# Patient Record
Sex: Male | Born: 1965 | Race: White | Hispanic: No | Smoking: Current every day smoker
Health system: Southern US, Community
[De-identification: ages and names within clinical notes are randomized; demographics above are authoritative.]

## PROBLEM LIST (undated history)

## (undated) DIAGNOSIS — Z9889 Other specified postprocedural states: Secondary | ICD-10-CM

## (undated) DIAGNOSIS — R51 Headache: Secondary | ICD-10-CM

## (undated) DIAGNOSIS — R112 Nausea with vomiting, unspecified: Secondary | ICD-10-CM

## (undated) DIAGNOSIS — T4145XA Adverse effect of unspecified anesthetic, initial encounter: Secondary | ICD-10-CM

## (undated) DIAGNOSIS — I2510 Atherosclerotic heart disease of native coronary artery without angina pectoris: Secondary | ICD-10-CM

## (undated) DIAGNOSIS — T8859XA Other complications of anesthesia, initial encounter: Secondary | ICD-10-CM

## (undated) DIAGNOSIS — Z8489 Family history of other specified conditions: Secondary | ICD-10-CM

## (undated) DIAGNOSIS — I739 Peripheral vascular disease, unspecified: Secondary | ICD-10-CM

## (undated) HISTORY — DX: Peripheral vascular disease, unspecified: I73.9

## (undated) HISTORY — PX: CORONARY ANGIOPLASTY WITH STENT PLACEMENT: SHX49

## (undated) HISTORY — PX: CATARACT EXTRACTION, BILATERAL: SHX1313

---

## 1997-12-19 ENCOUNTER — Emergency Department (HOSPITAL_COMMUNITY): Admission: EM | Admit: 1997-12-19 | Discharge: 1997-12-19 | Payer: Self-pay

## 1997-12-26 ENCOUNTER — Emergency Department (HOSPITAL_COMMUNITY): Admission: EM | Admit: 1997-12-26 | Discharge: 1997-12-26 | Payer: Self-pay | Admitting: Emergency Medicine

## 1997-12-26 ENCOUNTER — Emergency Department (HOSPITAL_COMMUNITY): Admission: EM | Admit: 1997-12-26 | Discharge: 1997-12-26 | Payer: Self-pay

## 1998-07-26 ENCOUNTER — Emergency Department (HOSPITAL_COMMUNITY): Admission: EM | Admit: 1998-07-26 | Discharge: 1998-07-26 | Payer: Self-pay | Admitting: Emergency Medicine

## 1998-09-14 ENCOUNTER — Encounter: Payer: Self-pay | Admitting: Emergency Medicine

## 1998-09-14 ENCOUNTER — Emergency Department (HOSPITAL_COMMUNITY): Admission: EM | Admit: 1998-09-14 | Discharge: 1998-09-14 | Payer: Self-pay | Admitting: Emergency Medicine

## 1998-09-29 ENCOUNTER — Emergency Department (HOSPITAL_COMMUNITY): Admission: EM | Admit: 1998-09-29 | Discharge: 1998-09-29 | Payer: Self-pay | Admitting: Emergency Medicine

## 1998-12-04 ENCOUNTER — Emergency Department (HOSPITAL_COMMUNITY): Admission: EM | Admit: 1998-12-04 | Discharge: 1998-12-04 | Payer: Self-pay | Admitting: Internal Medicine

## 1999-03-20 ENCOUNTER — Emergency Department (HOSPITAL_COMMUNITY): Admission: EM | Admit: 1999-03-20 | Discharge: 1999-03-20 | Payer: Self-pay | Admitting: Emergency Medicine

## 1999-07-14 ENCOUNTER — Emergency Department (HOSPITAL_COMMUNITY): Admission: EM | Admit: 1999-07-14 | Discharge: 1999-07-14 | Payer: Self-pay | Admitting: Emergency Medicine

## 1999-07-14 ENCOUNTER — Encounter: Payer: Self-pay | Admitting: Emergency Medicine

## 1999-07-23 ENCOUNTER — Emergency Department (HOSPITAL_COMMUNITY): Admission: EM | Admit: 1999-07-23 | Discharge: 1999-07-23 | Payer: Self-pay | Admitting: Emergency Medicine

## 2000-01-04 ENCOUNTER — Emergency Department (HOSPITAL_COMMUNITY): Admission: EM | Admit: 2000-01-04 | Discharge: 2000-01-04 | Payer: Self-pay | Admitting: *Deleted

## 2000-01-28 ENCOUNTER — Emergency Department (HOSPITAL_COMMUNITY): Admission: EM | Admit: 2000-01-28 | Discharge: 2000-01-28 | Payer: Self-pay

## 2000-05-14 ENCOUNTER — Emergency Department (HOSPITAL_COMMUNITY): Admission: EM | Admit: 2000-05-14 | Discharge: 2000-05-14 | Payer: Self-pay | Admitting: Internal Medicine

## 2000-08-04 ENCOUNTER — Emergency Department (HOSPITAL_COMMUNITY): Admission: EM | Admit: 2000-08-04 | Discharge: 2000-08-04 | Payer: Self-pay | Admitting: Emergency Medicine

## 2000-08-04 ENCOUNTER — Encounter: Payer: Self-pay | Admitting: Emergency Medicine

## 2000-09-04 ENCOUNTER — Emergency Department (HOSPITAL_COMMUNITY): Admission: EM | Admit: 2000-09-04 | Discharge: 2000-09-04 | Payer: Self-pay | Admitting: Emergency Medicine

## 2000-09-04 ENCOUNTER — Encounter: Payer: Self-pay | Admitting: Emergency Medicine

## 2001-04-02 ENCOUNTER — Encounter: Payer: Self-pay | Admitting: Emergency Medicine

## 2001-04-02 ENCOUNTER — Emergency Department (HOSPITAL_COMMUNITY): Admission: EM | Admit: 2001-04-02 | Discharge: 2001-04-02 | Payer: Self-pay | Admitting: Emergency Medicine

## 2001-06-26 ENCOUNTER — Encounter: Payer: Self-pay | Admitting: Emergency Medicine

## 2001-06-26 ENCOUNTER — Emergency Department (HOSPITAL_COMMUNITY): Admission: EM | Admit: 2001-06-26 | Discharge: 2001-06-26 | Payer: Self-pay | Admitting: Emergency Medicine

## 2001-08-05 ENCOUNTER — Emergency Department (HOSPITAL_COMMUNITY): Admission: EM | Admit: 2001-08-05 | Discharge: 2001-08-05 | Payer: Self-pay | Admitting: Emergency Medicine

## 2001-08-20 ENCOUNTER — Emergency Department (HOSPITAL_COMMUNITY): Admission: EM | Admit: 2001-08-20 | Discharge: 2001-08-20 | Payer: Self-pay | Admitting: Emergency Medicine

## 2001-08-20 ENCOUNTER — Encounter: Payer: Self-pay | Admitting: Emergency Medicine

## 2001-10-26 ENCOUNTER — Emergency Department (HOSPITAL_COMMUNITY): Admission: EM | Admit: 2001-10-26 | Discharge: 2001-10-26 | Payer: Self-pay | Admitting: Emergency Medicine

## 2001-10-26 ENCOUNTER — Encounter: Payer: Self-pay | Admitting: Emergency Medicine

## 2001-10-28 ENCOUNTER — Emergency Department (HOSPITAL_COMMUNITY): Admission: EM | Admit: 2001-10-28 | Discharge: 2001-10-28 | Payer: Self-pay | Admitting: Emergency Medicine

## 2002-01-21 ENCOUNTER — Emergency Department (HOSPITAL_COMMUNITY): Admission: EM | Admit: 2002-01-21 | Discharge: 2002-01-21 | Payer: Self-pay | Admitting: Emergency Medicine

## 2002-07-12 ENCOUNTER — Emergency Department (HOSPITAL_COMMUNITY): Admission: EM | Admit: 2002-07-12 | Discharge: 2002-07-12 | Payer: Self-pay | Admitting: Emergency Medicine

## 2002-08-19 ENCOUNTER — Emergency Department (HOSPITAL_COMMUNITY): Admission: EM | Admit: 2002-08-19 | Discharge: 2002-08-19 | Payer: Self-pay | Admitting: Emergency Medicine

## 2003-08-23 ENCOUNTER — Emergency Department (HOSPITAL_COMMUNITY): Admission: EM | Admit: 2003-08-23 | Discharge: 2003-08-23 | Payer: Self-pay | Admitting: Emergency Medicine

## 2003-12-02 ENCOUNTER — Emergency Department (HOSPITAL_COMMUNITY): Admission: EM | Admit: 2003-12-02 | Discharge: 2003-12-02 | Payer: Self-pay | Admitting: Emergency Medicine

## 2004-01-12 ENCOUNTER — Emergency Department (HOSPITAL_COMMUNITY): Admission: EM | Admit: 2004-01-12 | Discharge: 2004-01-12 | Payer: Self-pay | Admitting: Emergency Medicine

## 2005-07-19 ENCOUNTER — Emergency Department (HOSPITAL_COMMUNITY): Admission: EM | Admit: 2005-07-19 | Discharge: 2005-07-19 | Payer: Self-pay | Admitting: Emergency Medicine

## 2006-04-16 ENCOUNTER — Emergency Department (HOSPITAL_COMMUNITY): Admission: EM | Admit: 2006-04-16 | Discharge: 2006-04-16 | Payer: Self-pay | Admitting: Emergency Medicine

## 2007-03-18 ENCOUNTER — Emergency Department (HOSPITAL_COMMUNITY): Admission: EM | Admit: 2007-03-18 | Discharge: 2007-03-18 | Payer: Self-pay | Admitting: Emergency Medicine

## 2007-11-10 ENCOUNTER — Emergency Department (HOSPITAL_COMMUNITY): Admission: EM | Admit: 2007-11-10 | Discharge: 2007-11-11 | Payer: Self-pay | Admitting: Emergency Medicine

## 2008-02-15 ENCOUNTER — Encounter (INDEPENDENT_AMBULATORY_CARE_PROVIDER_SITE_OTHER): Payer: Self-pay | Admitting: Emergency Medicine

## 2008-02-15 ENCOUNTER — Ambulatory Visit: Payer: Self-pay | Admitting: Vascular Surgery

## 2008-02-15 ENCOUNTER — Emergency Department (HOSPITAL_COMMUNITY): Admission: EM | Admit: 2008-02-15 | Discharge: 2008-02-15 | Payer: Self-pay | Admitting: Emergency Medicine

## 2008-02-17 ENCOUNTER — Ambulatory Visit: Payer: Self-pay | Admitting: Vascular Surgery

## 2009-11-27 ENCOUNTER — Emergency Department (HOSPITAL_COMMUNITY): Admission: EM | Admit: 2009-11-27 | Discharge: 2009-11-27 | Payer: Self-pay | Admitting: Emergency Medicine

## 2009-11-28 ENCOUNTER — Encounter (INDEPENDENT_AMBULATORY_CARE_PROVIDER_SITE_OTHER): Payer: Self-pay | Admitting: Emergency Medicine

## 2009-11-28 ENCOUNTER — Ambulatory Visit: Payer: Self-pay | Admitting: Vascular Surgery

## 2009-11-28 ENCOUNTER — Ambulatory Visit: Admission: RE | Admit: 2009-11-28 | Discharge: 2009-11-28 | Payer: Self-pay | Admitting: Emergency Medicine

## 2010-05-26 ENCOUNTER — Emergency Department (HOSPITAL_COMMUNITY): Admission: EM | Admit: 2010-05-26 | Discharge: 2010-05-26 | Payer: Self-pay | Admitting: Emergency Medicine

## 2010-06-05 ENCOUNTER — Inpatient Hospital Stay (HOSPITAL_COMMUNITY)
Admission: EM | Admit: 2010-06-05 | Discharge: 2010-06-06 | Payer: Self-pay | Source: Home / Self Care | Attending: Interventional Cardiology | Admitting: Interventional Cardiology

## 2010-06-06 ENCOUNTER — Encounter (INDEPENDENT_AMBULATORY_CARE_PROVIDER_SITE_OTHER): Payer: Self-pay | Admitting: Interventional Cardiology

## 2010-08-02 ENCOUNTER — Ambulatory Visit (HOSPITAL_COMMUNITY)
Admission: RE | Admit: 2010-08-02 | Discharge: 2010-08-02 | Disposition: A | Discharge status: Home / Self Care | Payer: Self-pay | Source: Ambulatory Visit | Attending: Interventional Cardiology | Admitting: Interventional Cardiology

## 2010-08-02 DIAGNOSIS — I70219 Atherosclerosis of native arteries of extremities with intermittent claudication, unspecified extremity: Secondary | ICD-10-CM | POA: Insufficient documentation

## 2010-08-02 LAB — POCT ACTIVATED CLOTTING TIME
Activated Clotting Time: 181 seconds
Activated Clotting Time: 164 seconds
Activated Clotting Time: 152 seconds

## 2010-08-15 NOTE — Procedures (Signed)
NAME:  Jacob Duncan, Jacob Duncan NO.:  [id number]  MEDICAL RECORD NO.:  [id number]           PATIENT TYPE:  O  LOCATION:  CATH                         FACILITY:  [facility]  PHYSICIAN:  Corky Crafts, MDDATE OF BIRTH:  Nov 13, 1965  DATE OF PROCEDURE:  08/02/2010 DATE OF DISCHARGE:                   PERIPHERAL VASCULAR INVASIVE PROCEDURE   PROCEDURES PERFORMED: 1. Abdominal aortogram. 2. Pelvic angiogram. 3. Bilateral lower extremity runoff. 4. Selective right external iliac artery angiogram. 5. Atherectomy of the right common femoral artery and proximal right     SFA.  OPERATOR:  Corky Crafts, MD  INDICATIONS:  Claudication.  PROCEDURE NARRATIVE:  The risks and benefits of the PV angiography were explained to the patient.  Informed consent was obtained.  He was brought to the cath lab and was prepped and draped in usual sterile fashion.  His left groin was infiltrated with 1% lidocaine and 5-French sheath was placed in the left common femoral artery using the modified Seldinger technique.  A pigtail catheter was advanced to the abdominal aorta and power injection contrast was performed in AP projection.  The catheter was pulled back to the aortoiliac bifurcation and power injection of contrast was performed.  Bilateral lower extremity runoff was performed through the pigtail catheter.  A crossover catheter was then used to gain access to the right iliac system.  A glidewire was used for this eventually and end-hole catheter was placed in the right external iliac artery and the selective angiogram of the right common femoral and right external iliac area was obtained.  The catheter was then switched out for a 7-French Terumo crossover sheath over a Rosen wire.  The atherectomy was then performed.  Please see below for details.  Heparin was used for anticoagulation.  ACT was checked.  FINDINGS:  There was no abdominal aortic aneurysm and bilateral  single renal arteries both of which are widely patent.  The aortoiliac bifurcation is widely patent.  In the right leg, the common iliac artery, internal iliac artery, and external iliac arteries are widely patent.  The left leg common iliac artery, internal iliac artery, and external iliac arteries are all widely patent.  In the right leg, there is a 90% stenosis in the mid-to-distal common femoral artery extending into the proximal SFA.  The right profunda femoral artery has an ostial 80% stenosis.  The right SFA, popliteal, anterior tibial, posterior tibial, peroneal artery is widely patent.  Anterior tibial was small. Left leg shows a patent common femoral artery.  There is a 25% proximal SFA stenosis.  The remainder of the SFA, popliteal, anterior tibial, posterior tibial, and peroneal arteries are widely patent.  The anterior tibial artery on the left is small.  INTERVENTIONAL NARRATIVE:  A 7-French Terumo sheath was used. Spartacore wire was used to cross the lesion in the common femoral artery and SFA.  A Smooth Cutter TurboHawk device was used.  Five passes were performed, and there was an excellent angiographic result with no significant stenosis remaining.  IMPRESSION: 1. 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 2. No abdominal  aortic aneurysm or patent iliac systems bilaterally.     Patent mid-to-distal superficial femoral artery bilaterally, three-     vessel runoff below both knees.  RECOMMENDATIONS:  Continue aspirin and Plavix, for progressive secondary prevention, he is to stop smoking.     Corky Crafts, MD     JSV/MEDQ  D:  08/02/2010  T:  08/03/2010  Job:  [id number]  Electronically Signed by Lance Muss MD on 08/15/2010 09:45:09 AM

## 2010-09-11 LAB — POCT I-STAT, CHEM 8
BUN: 14 mg/dL (ref 6–23)
Calcium, Ion: 1.12 mmol/L (ref 1.12–1.32)
Chloride: 106 mEq/L (ref 96–112)
Creatinine, Ser: 0.9 mg/dL (ref 0.4–1.5)
Glucose, Bld: 97 mg/dL (ref 70–99)
HCT: 44 % (ref 39.0–52.0)
Hemoglobin: 15 g/dL (ref 13.0–17.0)
Potassium: 4.3 mEq/L (ref 3.5–5.1)
Sodium: 138 meq/L (ref 135–145)
TCO2: 23 mmol/L (ref 0–100)

## 2010-09-11 LAB — LIPID PANEL
HDL: 37 mg/dL — ABNORMAL LOW (ref >39)
LDL Cholesterol: 111 mg/dL — ABNORMAL HIGH (ref 0–99)
Total CHOL/HDL Ratio: 4.6 RATIO
Triglycerides: 113 mg/dL (ref <150)
VLDL: 23 mg/dL (ref 0–40)

## 2010-09-11 LAB — CARDIAC PANEL(CRET KIN+CKTOT+MB+TROPI)
CK, MB: 1.1 ng/mL (ref 0.3–4.0)
CK, MB: 1.2 ng/mL (ref 0.3–4.0)
Total CK: 122 U/L (ref 7–232)
Total CK: 137 U/L (ref 7–232)
Troponin I: 0.01 ng/mL (ref 0.00–0.06)
Troponin I: 0.04 ng/mL (ref 0.00–0.06)

## 2010-09-11 LAB — POCT CARDIAC MARKERS
CKMB, poc: <1 ng/mL — ABNORMAL LOW (ref 1.0–8.0)
Myoglobin, poc: 22.2 ng/mL (ref 12–200)
Troponin i, poc: <0.05 ng/mL (ref 0.00–0.09)

## 2010-09-11 LAB — CBC
MCHC: 33.9 g/dL (ref 30.0–36.0)
Platelets: 185 10*3/uL (ref 150–400)
RDW: 13.4 % (ref 11.5–15.5)
WBC: 6.7 10*3/uL (ref 4.0–10.5)

## 2010-09-11 LAB — BASIC METABOLIC PANEL
CO2: 25 mEq/L (ref 19–32)
Calcium: 8.5 mg/dL (ref 8.4–10.5)
Creatinine, Ser: 0.96 mg/dL (ref 0.4–1.5)
GFR calc non Af Amer: >60 mL/min (ref >60)
Glucose, Bld: 100 mg/dL — ABNORMAL HIGH (ref 70–99)
Sodium: 137 mEq/L (ref 135–145)

## 2010-09-11 LAB — CK TOTAL AND CKMB (NOT AT ARMC): Relative Index: 0.7 (ref 0.0–2.5)

## 2010-09-11 LAB — TROPONIN I: Troponin I: 0.03 ng/mL (ref 0.00–0.06)

## 2010-09-12 LAB — CBC
Hemoglobin: 14.8 g/dL (ref 13.0–17.0)
MCH: 32.5 pg (ref 26.0–34.0)
MCHC: 34.5 g/dL (ref 30.0–36.0)
RDW: 13.5 % (ref 11.5–15.5)

## 2010-09-12 LAB — BASIC METABOLIC PANEL
BUN: 13 mg/dL (ref 6–23)
CO2: 25 mEq/L (ref 19–32)
Calcium: 9.7 mg/dL (ref 8.4–10.5)
Creatinine, Ser: 0.96 mg/dL (ref 0.4–1.5)
Glucose, Bld: 106 mg/dL — ABNORMAL HIGH (ref 70–99)
Sodium: 139 mEq/L (ref 135–145)

## 2010-09-12 LAB — DIFFERENTIAL
Basophils Absolute: 0 10*3/uL (ref 0.0–0.1)
Basophils Relative: 1 % (ref 0–1)
Eosinophils Absolute: 0.1 10*3/uL (ref 0.0–0.7)
Monocytes Absolute: 0.4 10*3/uL (ref 0.1–1.0)
Monocytes Relative: 10 % (ref 3–12)
Neutrophils Relative %: 50 % (ref 43–77)

## 2010-09-12 LAB — PROTIME-INR
INR: 0.93 (ref 0.00–1.49)
Prothrombin Time: 12.7 seconds (ref 11.6–15.2)

## 2010-09-12 LAB — POCT CARDIAC MARKERS
CKMB, poc: <1 ng/mL — ABNORMAL LOW (ref 1.0–8.0)
Troponin i, poc: <0.05 ng/mL (ref 0.00–0.09)

## 2010-09-12 LAB — HEPATIC FUNCTION PANEL
Albumin: 4 g/dL (ref 3.5–5.2)
Bilirubin, Direct: 0.1 mg/dL (ref 0.0–0.3)
Total Bilirubin: 0.7 mg/dL (ref 0.3–1.2)

## 2010-09-12 LAB — LIPASE, BLOOD: Lipase: 23 U/L (ref 11–59)

## 2010-11-16 NOTE — Assessment & Plan Note (Signed)
OFFICE VISIT   Jacob Duncan, Jacob Duncan  DOB:  May 25, 1966                                       02/18/2008  CHART#:[id number]   The patient is a 45 year old male who was seen at the [facility]  Emergency Room yesterday for symptoms of claudication in his right lower  extremity.  He states that for 1 month now he has had difficulty walking  more than a block in his right leg.  He states that he has had  difficulty at work because of this.  He currently is working as a  Education administrator.  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  He currently smokes 1-1/2  packs of cigarettes per day.  He denies any rest pain in the right foot.  His atherosclerotic risk factors are negative for cholesterol,  hypertension or diabetes.  He also denies any family history of  significant vascular disease.   FAMILY HISTORY:  Unremarkable.   SOCIAL HISTORY:  He is single, has 3 children, works as a Education administrator.  He  does not drink alcohol regularly.  Smoking history is as above, he has  smoked for 30 years.   REVIEW OF SYSTEMS:  He is 5 feet 11 inches, 135 pounds.  CARDIAC/PULMONARY/RENAL/NEUROLOGIC/PSYCHIATRIC/ENT/HEMATOLOGIC REVIEW OF  SYSTEMS:  Are all negative.  GI:  He does have a history of some reflux.  VASCULAR:  Claudication symptoms as described above.  ORTHOPEDIC:  He has muscle pain in his right leg again related to his  claudication symptoms.   PAST SURGICAL HISTORY:  He had an injury in his right arm which required  apparently extensive reconstruction, he does not know if a vascular  repair was performed.   He is on no medications.  He has a side effect codeine which causes  nausea.   PHYSICAL EXAMINATION:  Blood pressure is 122/74 in the left arm, heart  rate is 56 and regular.  HEENT:  Unremarkable.  Has 2+ carotid pulses  without bruit.  Chest:  Clear to auscultation.  Cardiac Exam:  Regular  rate and  rhythm without murmur.  Abdomen:  Soft, nontender, nondistended  with no masses.  Lower Extremities:  He has 2+ femoral pulses, left  lower extremity has a 1+ left popliteal and 1+ dorsalis pedis pulse,  right lower extremity has absent popliteal and pedal pulses.  Both feet  are pink, warm and adequately perfused.  There are no ulcerations on the  feet.  There is no edema.  ABI which were 0.75 on the right side and  greater than 1 on the left side, these were done yesterday.   I believe the patient probably has right superficial femoral artery  occlusive disease.  He has fairly short distance claudication currently.  However, I did discuss with him that if he is able to quit smoking and  start a walking program of 30 minutes daily, with the addition of Pletal  and aspirin, that he should be able to improve his walking distance  significantly and reduce his long-term risk of limb loss.  We will try  this conservative management program first to see if he has improvement  of his symptoms.  He will follow up in a few months' time to see if his  symptoms have  improved at all.  If his symptoms become worse over the  next few weeks or become more debilitating to him and he wishes to  consider intervention at that point, he will return for follow up  sooner.   Janetta Hora. Fields, MD  Electronically Signed   CEF/MEDQ  D:  02/18/2008  T:  02/18/2008  Job:  [id number]

## 2011-06-04 ENCOUNTER — Encounter: Payer: Self-pay | Admitting: *Deleted

## 2011-06-04 ENCOUNTER — Emergency Department (HOSPITAL_COMMUNITY)
Admission: EM | Admit: 2011-06-04 | Discharge: 2011-06-05 | Discharge status: Against Medical Advice | Payer: Self-pay | Attending: Emergency Medicine | Admitting: Emergency Medicine

## 2011-06-04 DIAGNOSIS — R079 Chest pain, unspecified: Secondary | ICD-10-CM | POA: Insufficient documentation

## 2011-06-04 DIAGNOSIS — R0602 Shortness of breath: Secondary | ICD-10-CM | POA: Insufficient documentation

## 2011-06-04 NOTE — ED Notes (Signed)
Pt states "was moving sheet rock mud yesterday & pulled a muscle"; pt c/o right upper shoulder/chest pain

## 2012-04-01 IMAGING — CR DG CHEST 1V PORT
1 series · 1 of 1 positions shown · non-contrast
Comparison: None.

CLINICAL DATA: 44-year-old with chest pain.

PORTABLE CHEST - 1 VIEW

[view not recorded]
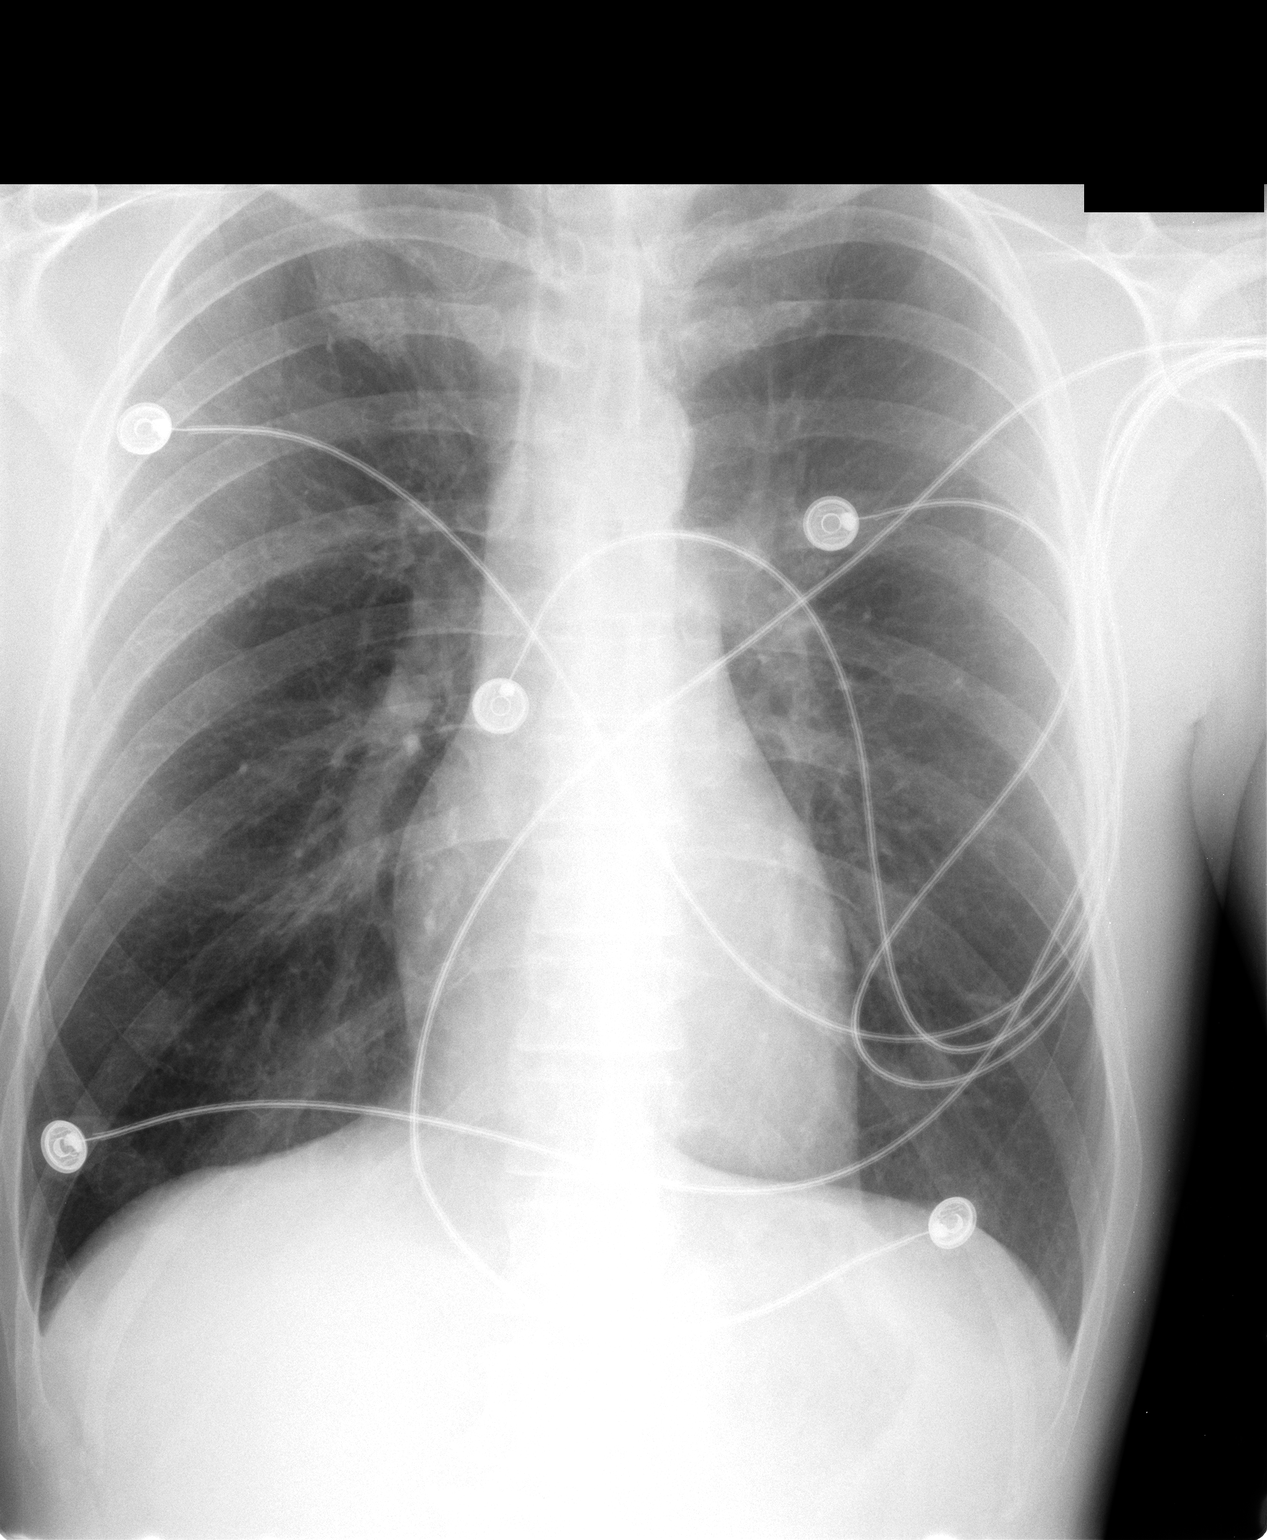

[1 of 1 positions shown; findings below may reference images not displayed]

FINDINGS: Single view of the chest demonstrates mild hyperinflation
of the lungs.  The lungs are clear without focal airspace disease
or pulmonary edema.  Heart and mediastinum are within normal limits
and the trachea is midline.  Osseous structures are intact.  Few
densities in the lung apices may represent scar.
IMPRESSION: No acute chest findings.

## 2012-04-01 IMAGING — CT CT ANGIO CHEST
2 of 6 series · 18 of 36 positions shown · IV contrast (APPLIED)
Comparison: None.

CLINICAL DATA: Chest pain.  History of right lower extremity DVT.

CT ANGIOGRAPHY CHEST WITH CONTRAST
TECHNIQUE: Multidetector CT imaging of the chest was performed
using the standard protocol during bolus administration of
intravenous contrast.  Multiplanar CT image reconstructions
including MIPs were obtained to evaluate the vascular anatomy.
Contrast:  100 ml of Omnipaque-5UU

[Series 6: thins · axial · 0.70mm/px · z∈[-348,-25]mm · 17 of 359 slices shown]
[im 18/359  lung]
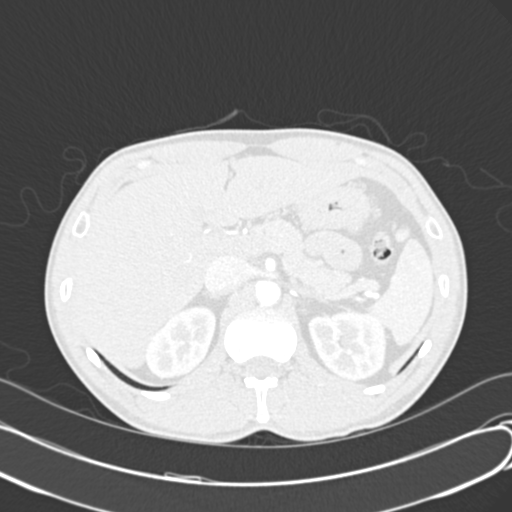
[im 36/359  mediastinal]
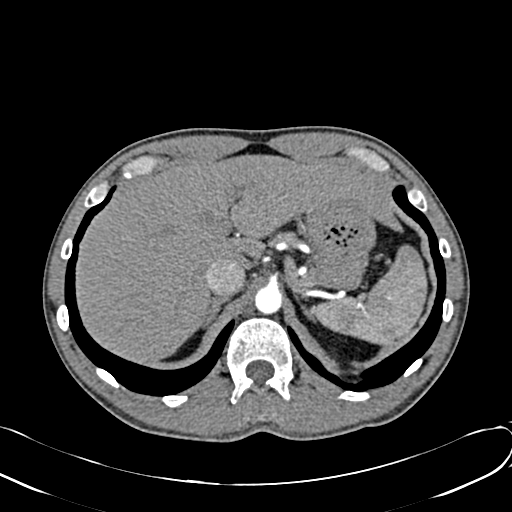
[im 54/359  lung]
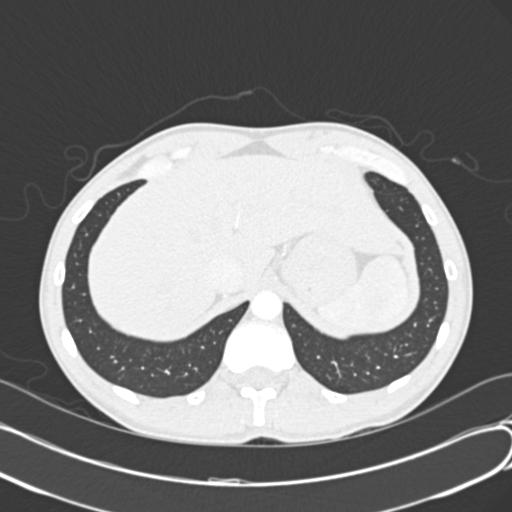
[im 72/359  mediastinal]
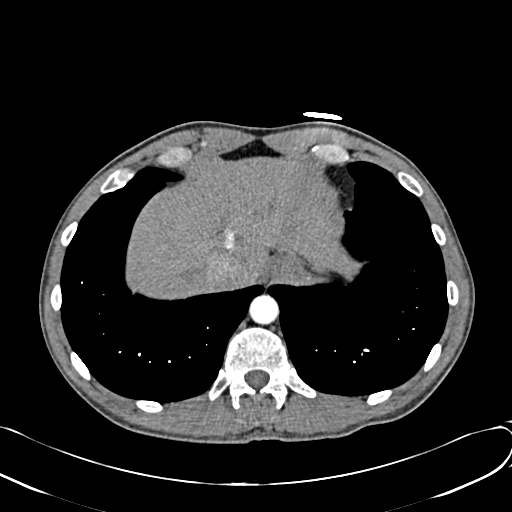
[im 108/359  lung]
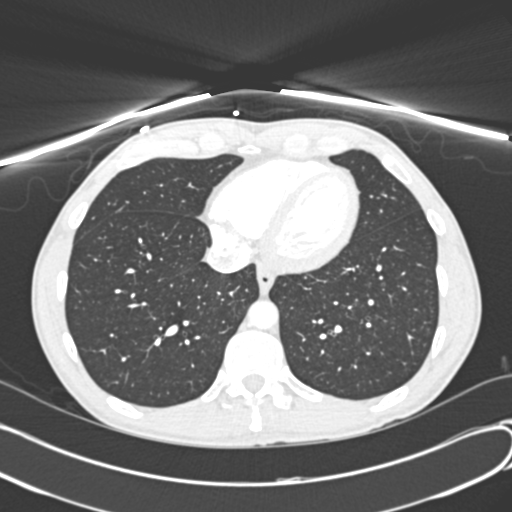
[im 126/359  mediastinal]
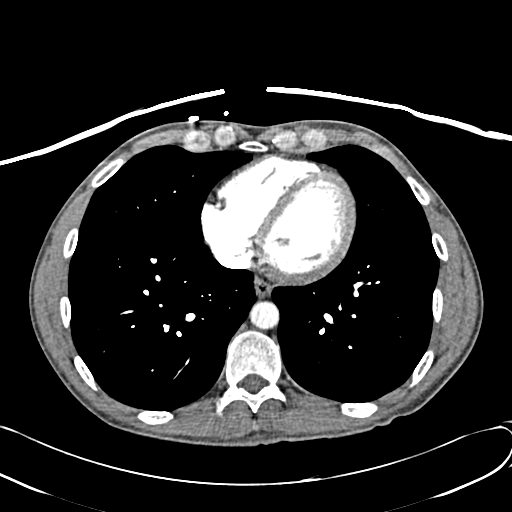
[im 144/359  lung]
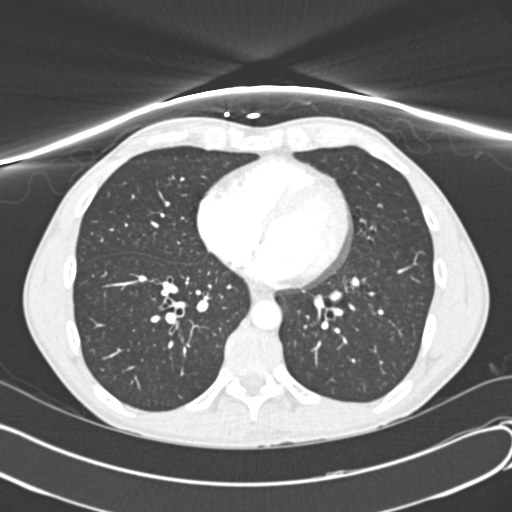
[im 162/359  mediastinal]
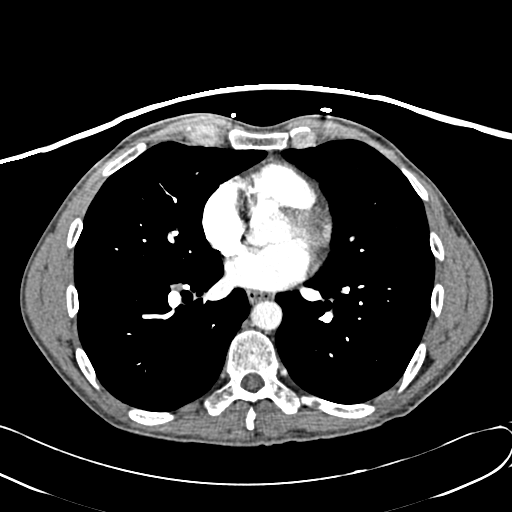
[im 180/359  lung]
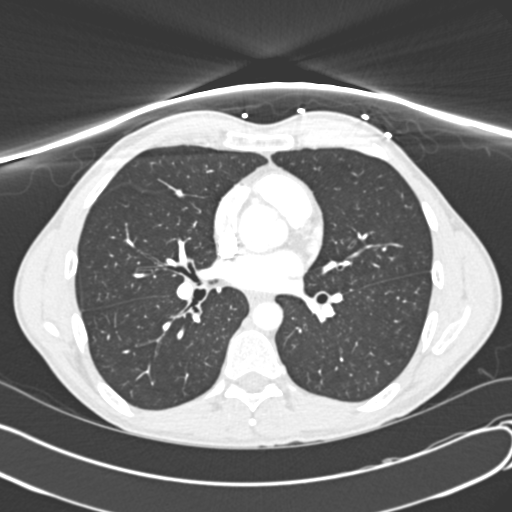
[im 197/359  mediastinal]
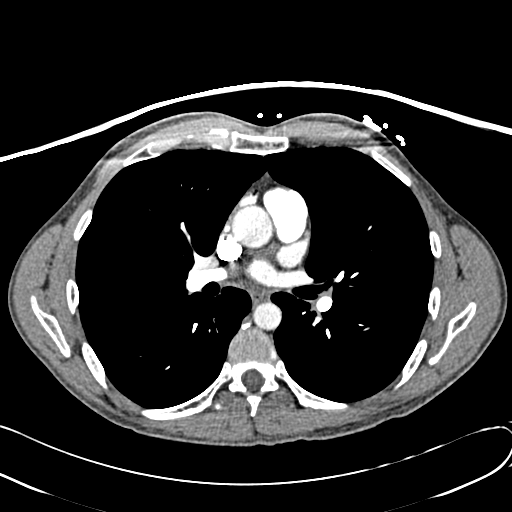
[im 215/359  lung]
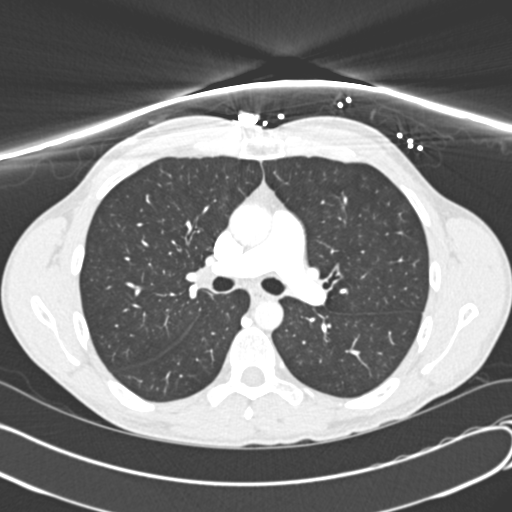
[im 233/359  mediastinal]
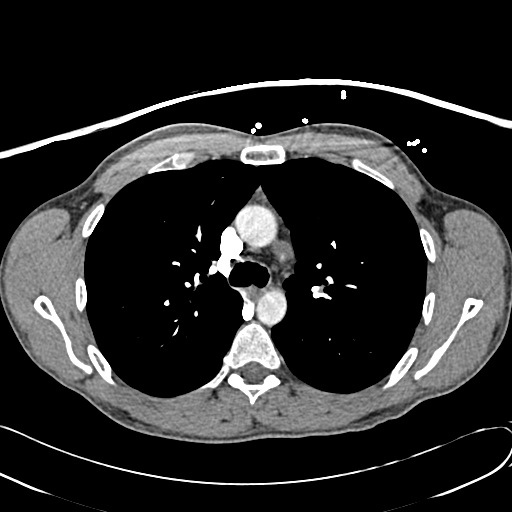
[im 251/359  lung]
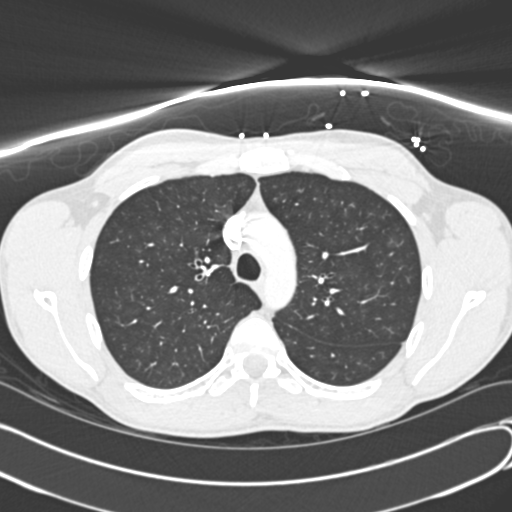
[im 287/359  mediastinal]
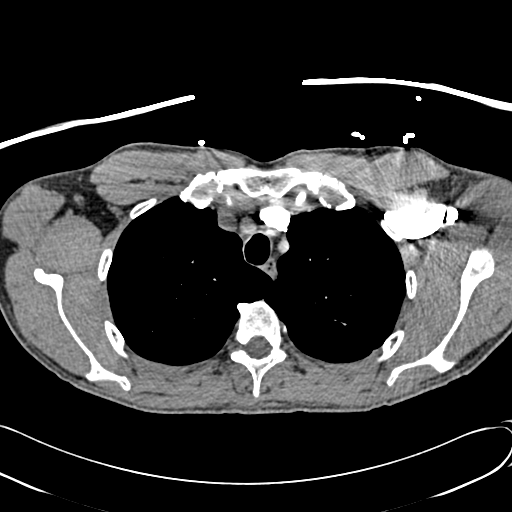
[im 305/359  lung]
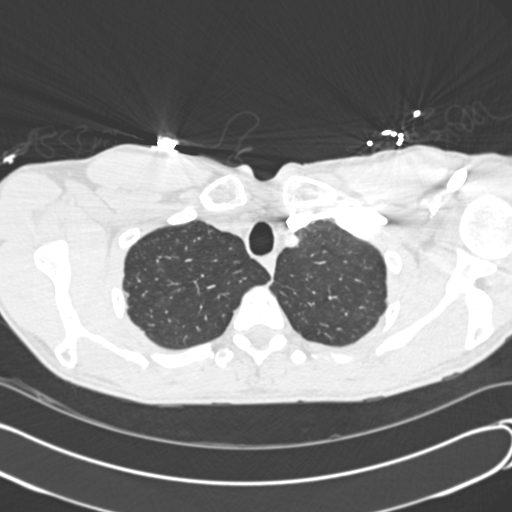
[im 323/359  mediastinal]
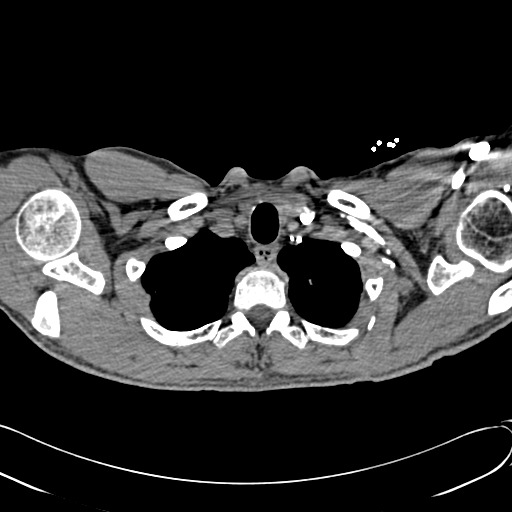
[im 341/359  lung]
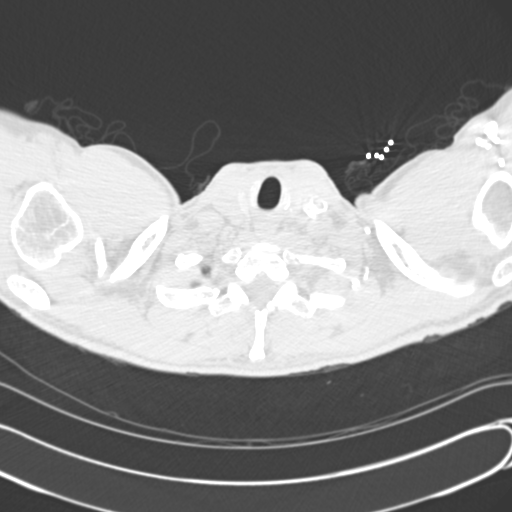

[Series 602: coronal chest · coronal · 0.70mm/px · 1 of 103 slices shown]
[im 52/103  mediastinal]
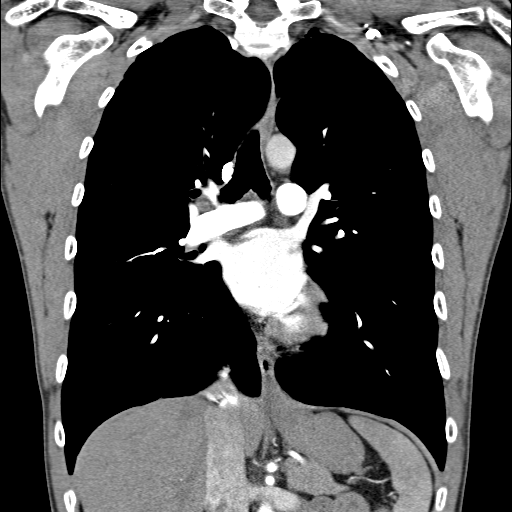

[18 of 36 positions shown; findings below may reference images not displayed]

FINDINGS: No filling defects are seen in the central, segmental or
subsegmental pulmonary arteries.  The pulmonary artery is normal in
caliber.  The aorta and major branch vessels are normal in caliber
and patent.  The heart is normal in size.  No mediastinal or hilar
adenopathy is seen.  The visualized thyroid is unremarkable.

The trachea and central airways are patent.  Areas of scarring seen
at the lung apices.  Developing centrolobular emphysema is noted.
There is a 3 mm pulmonary nodule is seen in the right upper lobe on
series 7, image 40.  There is a calcified nodule at the right lung
base.  Several areas of ground-glass attenuation and tree in bud
nodularity are seen in the superior segment of the lower lobes and
in the upper lobes.  The lungs are otherwise clear.  No pleural
effusions are seen.

No aggressive lytic or blastic bone lesions are seen.

The visualized upper abdomen is unremarkable.  A small Bochdalek
hernia is visualized on the left.

Review of the MIP images confirms the above findings.
IMPRESSION: 1.  No evidence of pulmonary embolism.
2.  Several ground-glass / tree in bud opacities seen in the
superior segment of the lower lobes and in the upper lobes
suggesting respiratory bronchiolitis-interstitial lung disease.
3.  3 mm pulmonary nodule in the right upper lobe.  If the patient
is at high risk for bronchogenic carcinoma, follow-up chest CT at 1
year is recommended.  If the patient is at low risk, no follow-up
is needed.  This recommendation follows the consensus statement:
Guidelines for Management of Small Pulmonary Nodules Detected on CT
Scans:  A Statement from the [HOSPITAL] as published in
[URL]

## 2013-12-20 ENCOUNTER — Emergency Department (HOSPITAL_COMMUNITY)
Admission: EM | Admit: 2013-12-20 | Discharge: 2013-12-20 | Disposition: A | Discharge status: Home / Self Care | Payer: PRIVATE HEALTH INSURANCE | Attending: Emergency Medicine | Admitting: Emergency Medicine

## 2013-12-20 ENCOUNTER — Encounter (HOSPITAL_COMMUNITY): Payer: Self-pay | Admitting: Emergency Medicine

## 2013-12-20 DIAGNOSIS — Z9861 Coronary angioplasty status: Secondary | ICD-10-CM | POA: Insufficient documentation

## 2013-12-20 DIAGNOSIS — F17200 Nicotine dependence, unspecified, uncomplicated: Secondary | ICD-10-CM | POA: Insufficient documentation

## 2013-12-20 DIAGNOSIS — Z7982 Long term (current) use of aspirin: Secondary | ICD-10-CM | POA: Insufficient documentation

## 2013-12-20 DIAGNOSIS — I739 Peripheral vascular disease, unspecified: Secondary | ICD-10-CM | POA: Insufficient documentation

## 2013-12-20 NOTE — ED Notes (Addendum)
ERROR IN DOCUMENTATION SECONDARY PERFORMED AT [phone]

## 2013-12-20 NOTE — Discharge Instructions (Signed)
Intermittent Claudication Blockage of leg arteries results from poor circulation of blood in the leg arteries. This produces an aching, tired, and sometimes burning pain in the legs that is brought on by exercise and made better by rest. Claudication refers to the limping that happens from leg cramps. It is also referred to as Vaso-occlusive disease of the legs, arterial insufficiency of the legs, recurrent leg pain, recurrent leg cramping and calf pain with exercise.  CAUSES  This condition is due to narrowing or blockage of the arteries (muscular vessels which carry blood away from the heart and around the body). Blockage of arteries can occur anywhere in the body. If they occur in the heart, a person may experience angina (chest pain) or even a heart attack. If they occur in the neck or the brain, a person may have a stroke. Intermittent claudication is when the blockage occurs in the legs, most commonly in the calf or the foot.  Atherosclerosis, or blockage of arteries, can occur for many reasons. Some of these are smoking, diabetes, and high cholesterol. SYMPTOMS  Intermittent claudication may occur in both legs, and it often continues to get worse over time. However, some people complain only of weakness in the legs when walking, or a feeling of "tiredness" in the buttocks. Impotence (not able to have an erection) is an occasional complaint in men. Pain while resting is uncommon.  WHAT TO EXPECT AT [facility] PROVIDER'S OFFICE: Your medical history will be asked for and a physical examination will be performed. Medical history questions documenting claudication in detail may include:   Time pattern  Do you have leg cramps at night (nocturnal cramps)?  How often does leg pain with cramping occur?  Is it getting worse?  What is the quality of the pain?  Is the pain sharp?  Is there an aching pain with the cramps?  Aggravating factors  Is it worse after you exercise?  Is it  worse after you are standing for a while?  Do you smoke? How much?  Do you drink alcohol? How much?  Are you diabetic? How well is your blood sugar controlled?  Other  What other symptoms are also present?  Has there been impotence (men)?  Is there pain in the back?  Is there a darkening of the skin of the legs, feet or toes?  Is there weakness or paralysis of the legs? The physical examination may include evaluation of the femoral pulse (in the groin) and the other areas where the pulse can be felt in the legs. DIAGNOSIS  Diagnostic tests that may be performed include:  Blood pressure measured in arms and legs for comparison.  Doppler ultrasonography on the legs and the heart.  Duplex Doppler/ultrasound exam of extremity to visualize arterial blood flow.  ECG- to evaluate the activity of your heart.  Aortography- to visualize blockages in your arteries. TREATMENT Surgical treatment may be suggested if claudication interferes with the patient's activities or work, and if the diseased arteries do not seem to be improving after treatment. Be aware that this condition can worsen over time and you should carefully monitor your condition. HOME CARE INSTRUCTIONS  Talk to your caregiver about the cause of your leg cramping and about what to do at home to relieve it.  A healthy diet is important to lessen the likeliness of atherosclerosis.  A program of daily walking for short periods, and stopping for pain or cramping, may help improve function.  It is important to  stop smoking.  Avoid putting hot or cold items on legs.  Avoid tight shoes. SEEK MEDICAL CARE IF: There are many other causes of leg pain such as arthritis or low blood potassium. However, some causes of leg pain may be life threatening such as a blood clot in the legs. Seek medical attention if you have:  Leg pain that does not go away.  Legs that may be red, hot or swollen.  Ulcers or sores appear on your  ankle or foot.  Any chest pain or shortness of breath accompanying leg pain.  Diabetes.  You are pregnant. SEEK IMMEDIATE MEDICAL CARE IF:   Your leg pain becomes severe or will not go away.  Your foot turns blue or a dark color.  Your leg becomes red, hot or swollen or you develop a fever over 102F.  Any chest pain or shortness of breath accompanying leg pain. MAKE SURE YOU:   Understand these instructions.  Will watch your condition.  Will get help right away if you are not doing well or get worse. Muscle Cramps and Spasms Muscle cramps and spasms occur when a muscle or muscles tighten and you have no control over this tightening (involuntary muscle contraction). They are a common problem and can develop in any muscle. The most common place is in the calf muscles of the leg. Both muscle cramps and muscle spasms are involuntary muscle contractions, but they also have differences:  Muscle cramps are sporadic and painful. They may last a few seconds to a quarter of an hour. Muscle cramps are often more forceful and last longer than muscle spasms. Muscle spasms may or may not be painful. They may also last just a few seconds or much longer. CAUSES  It is uncommon for cramps or spasms to be due to a serious underlying problem. In many cases, the cause of cramps or spasms is unknown. Some common causes are:  Overexertion.  Overuse from repetitive motions (doing the same thing over and over).  Remaining in a certain position for a long period of time.  Improper preparation, form, or technique while performing a sport or activity.  Dehydration.  Injury.  Side effects of some medicines.  Abnormally low levels of the salts and ions in your blood (electrolytes), especially potassium and calcium. This could happen if you are taking water pills (diuretics) or you are pregnant.  Some underlying medical problems can make it more likely to develop cramps or spasms. These include, but  are not limited to:  Diabetes.  Parkinson disease.  Hormone disorders, such as thyroid problems.  Alcohol abuse.  Diseases specific to muscles, joints, and bones.  Blood vessel disease where not enough blood is getting to the muscles.  HOME CARE INSTRUCTIONS  Stay well hydrated. Drink enough water and fluids to keep your urine clear or pale yellow. It may be helpful to massage, stretch, and relax the affected muscle. For tight or tense muscles, use a warm towel, heating pad, or hot shower water directed to the affected area. Medicines used to treat a known cause of cramps or spasms may help reduce their frequency or severity. Only take over-the-counter or prescription medicines as directed by your caregiver. SEEK MEDICAL CARE IF:  Your cramps or spasms get more severe, more frequent, or do not improve over time.  MAKE SURE YOU:  Understand these instructions. Will watch your condition. Will get help right away if you are not doing well or get worse. Document Released: 12/07/2001 Document Revised:  10/12/2012 Document Reviewed: 06/03/2012 ExitCare Patient Information 2015 [employer]. This information is not intended to replace advice given to you by your health care provider. Make sure you discuss any questions you have with your health care provider.  Document Released: 04/19/2004 Document Revised: 09/09/2011 Document Reviewed: 02/05/2008 [facility] Patient Information 2015 [employer] [place]. This information is not intended to replace advice given to you by your health care provider. Make sure you discuss any questions you have with your health care provider.

## 2013-12-20 NOTE — ED Notes (Signed)
Pt c/o right leg pain that started 3 years ago. Pt states he smoked too much and he had stents placed in arteries, states he stopped smoking 2 weeks ago and pain has not eased up. At night he will get cramps in legs.

## 2013-12-20 NOTE — ED Provider Notes (Signed)
CSN: [id number]     Arrival date & time 12/20/13  [id number] History   First MD Initiated Contact with Patient 12/20/13 [phone]     Chief Complaint  Patient presents with  . Leg Pain    right     (Consider location/radiation/quality/duration/timing/severity/associated sxs/prior Treatment) Patient is a 48 y.o. male presenting with leg pain.  Leg Pain Location:  Leg Injury: no   Leg location:  R lower leg Pain details:    Quality:  Aching and cramping   Radiates to:  Does not radiate   Severity:  Severe   Onset quality:  Sudden   Timing:  Intermittent   Progression:  Worsening Chronicity:  Recurrent Relieved by:  Rest Worsened by:  Exercise Associated symptoms: no fever and no swelling     History reviewed. No pertinent past medical history. Past Surgical History  Procedure Laterality Date  . Coronary angioplasty with stent placement     No family history on file. History  Substance Use Topics  . Smoking status: Current Every Day Smoker -- 1.50 packs/day  . Smokeless tobacco: Not on file  . Alcohol Use: No    Review of Systems  Constitutional: Negative for fever.  All other systems reviewed and are negative.     Allergies  Codeine  Home Medications   Prior to Admission medications   Medication Sig Start Date End Date Taking? Authorizing Jory Welke  aspirin 81 MG chewable tablet Chew 81 mg by mouth daily.    Yes Historical Narmeen Kerper, MD  ibuprofen (ADVIL,MOTRIN) 200 MG tablet Take 400 mg by mouth every 6 (six) hours as needed for moderate pain.   Yes Historical Lunetta Marina, MD   BP 123/81  Pulse 58  Temp(Src) 98 F (36.7 C) (Oral)  Resp 18  SpO2 96% Physical Exam  Nursing note and vitals reviewed. Constitutional: He is oriented to person, place, and time. He appears well-developed and well-nourished. No distress.  HENT:  Head: Normocephalic and atraumatic.  Eyes: Conjunctivae are normal. No scleral icterus.  Neck: Neck supple.  Cardiovascular: Normal rate and  intact distal pulses.   Pulmonary/Chest: Effort normal. No stridor. No respiratory distress.  Abdominal: Normal appearance. He exhibits no distension.  Musculoskeletal:  No lower extremity edema.  Unable to palpate DP pulses in either foot.  Able to doppler DP pulses easily in both feet.  Feet are warm with intact sensation and motor.  Neurological: He is alert and oriented to person, place, and time.  Skin: Skin is warm and dry. No rash noted.  Psychiatric: He has a normal mood and affect. His behavior is normal.    ED Course  Procedures (including critical care time) Labs Review Labs Reviewed - No data to display  Imaging Review No results found.   EKG Interpretation None      MDM   Final diagnoses:  Claudication    48 year old male with symptoms classic for claudication. He has had a stent placed in his right leg for this reason in the past. Symptoms have been progressive over the past few months, now getting to the point where he can hardly walk a block without pain. He also wakes up at night with cramping pain. Left leg is asymptomatic.  He quit smoking 2 weeks ago and has been wearing a nicotine patch, which has helped.  ABIs performed by me: Right - 0.66 Left - 0.79  I arranged for follow up through Dr. Arbie CookeyEarly.      Candyce ChurnJohn David Wofford III, MD 12/20/13 [phone]

## 2013-12-21 ENCOUNTER — Encounter: Payer: Self-pay | Admitting: Vascular Surgery

## 2013-12-22 ENCOUNTER — Other Ambulatory Visit: Payer: Self-pay

## 2013-12-22 ENCOUNTER — Other Ambulatory Visit: Payer: Self-pay | Admitting: *Deleted

## 2013-12-22 ENCOUNTER — Ambulatory Visit (INDEPENDENT_AMBULATORY_CARE_PROVIDER_SITE_OTHER)
Admission: RE | Admit: 2013-12-22 | Discharge: 2013-12-22 | Disposition: A | Discharge status: Home / Self Care | Payer: Self-pay | Source: Ambulatory Visit | Attending: Vascular Surgery | Admitting: Vascular Surgery

## 2013-12-22 ENCOUNTER — Ambulatory Visit (HOSPITAL_COMMUNITY)
Admission: RE | Admit: 2013-12-22 | Discharge: 2013-12-22 | Disposition: A | Discharge status: Home / Self Care | Payer: Self-pay | Source: Ambulatory Visit | Attending: Vascular Surgery | Admitting: Vascular Surgery

## 2013-12-22 ENCOUNTER — Encounter: Payer: Self-pay | Admitting: Vascular Surgery

## 2013-12-22 ENCOUNTER — Ambulatory Visit (INDEPENDENT_AMBULATORY_CARE_PROVIDER_SITE_OTHER): Payer: Self-pay | Admitting: Vascular Surgery

## 2013-12-22 VITALS — BP 143/87 | HR 98 | Resp 14 | Ht 71.0 in | Wt 146.0 lb

## 2013-12-22 DIAGNOSIS — I70219 Atherosclerosis of native arteries of extremities with intermittent claudication, unspecified extremity: Secondary | ICD-10-CM

## 2013-12-22 NOTE — Progress Notes (Signed)
Patient ID: Jacob Duncan, male   DOB: February 27, 1966, 48 y.o.   MRN: [id number]  Reason for Consult: PVD   Right lower extremity claudication. Referred by the emergency department.  Referred by No ref. Jacob Duncan found ED  Subjective:     HPI:  Jacob Duncan is a 48 y.o. male who was evaluated in the emergency department on 12/20/2013 with right lower extremity claudication. In the emergency department ABI on the right was 66% on the left 79%. Dr. Arbie CookeyEarly arrange for an outpatient visit in the office for the patient. The patient states that he's had 3 previous stents. However, I am unable to find any records on EPIC of these procedures.  He experiences pain in the right calf which is brought on by ambulation and relieved with rest. This occurs a short distance. He does not remember any acute onset of symptoms. His symptoms have gradually progressed. He describes the symptoms as severe. He denies any rest pain although he does get cramps in his right lower extremity at night. He denies any history of nonhealing wounds.  He does have a history of 2 packs per day of smoking but is currently trying to quit. He denies any history of diabetes, hypertension, hypercholesterolemia, or history of premature cardiovascular disease.  Past Medical History  Diagnosis Date  . Peripheral vascular disease    Family History  Problem Relation Age of Onset  . Deep vein thrombosis Mother    Past Surgical History  Procedure Laterality Date  . Coronary angioplasty with stent placement     Short Social History:  History  Substance Use Topics  . Smoking status: Former Smoker -- 1.50 packs/day    Quit date: 12/01/2013  . Smokeless tobacco: Never Used  . Alcohol Use: No   Allergies  Allergen Reactions  . Codeine     " room starts spinning"   Current Outpatient Prescriptions  Medication Sig Dispense Refill  . aspirin 81 MG chewable tablet Chew 81 mg by mouth daily.       Marland Kitchen. ibuprofen  (ADVIL,MOTRIN) 200 MG tablet Take 400 mg by mouth every 6 (six) hours as needed for moderate pain.       No current facility-administered medications for this visit.   Review of Systems  Constitutional: Negative for chills and fever.  Eyes: Negative for loss of vision.  Respiratory: Negative for cough and wheezing.  Cardiovascular: Positive for claudication. Negative for chest pain, chest tightness, dyspnea with exertion, orthopnea and palpitations.  GI: Negative for blood in stool and vomiting.  GU: Negative for dysuria and hematuria.  Musculoskeletal: Negative for leg pain, joint pain and myalgias.  Skin: Negative for rash and wound.  Neurological: Positive for numbness. Negative for dizziness and speech difficulty.  Hematologic: Negative for bruises/bleeds easily. Psychiatric: Negative for depressed mood.        Objective:  Objective  Filed Vitals:   12/22/13 1246  BP: 143/87  Pulse: 98  Resp: 14  Height: 5\' 11"  (1.803 m)  Weight: 146 lb (66.225 kg)   Body mass index is 20.37 kg/(m^2).  Physical Exam  Constitutional: He is oriented to person, place, and time. He appears well-developed and well-nourished.  HENT:  Head: Normocephalic and atraumatic.  Neck: Neck supple. No JVD present. No thyromegaly present.  Cardiovascular: Normal rate, regular rhythm, S1 normal, S2 normal and normal heart sounds.  Exam reveals no friction rub.   No murmur heard. Pulses:      Femoral pulses are 0 on  the right side, and 1+ on the left side.      Popliteal pulses are 0 on the right side, and 2+ on the left side.       Dorsalis pedis pulses are 0 on the right side, and 0 on the left side.       Posterior tibial pulses are 0 on the right side, and 2+ on the left side.  Pulmonary/Chest: Breath sounds normal. He has no wheezes. He has no rales.  Abdominal: Soft. Bowel sounds are normal. There is no tenderness.  I do not palpate an aneurysm.  Musculoskeletal: Normal range of motion. He  exhibits no edema.  Lymphadenopathy:    He has no cervical adenopathy.  Neurological: He is alert and oriented to person, place, and time. He has normal strength. No sensory deficit.  Skin: No lesion and no rash noted.  Psychiatric: He has a normal mood and affect.   Data: I have reviewed his records from the emergency department. In the emergency department ABI on the right was 66%. ABI on the left was 79%.  I have independently interpreted his arterial Doppler study in our office today which shows monophasic Doppler wave forms from the common femoral artery throughout the entire right lower extremity. ABI on the right is 59%. In our office today the ABI on the left was 100%. He was noted to have triphasic Doppler signals in the dorsalis pedis and posterior tibial positions on the left.      Assessment/Plan:    Atherosclerotic PVD with intermittent claudication This patient presents with disabling claudication of the right lower extremity. He feels that his symptoms are severe. He states he's had previous stents but does not remember the details I cannot find the previous records. He does state that on her previous arteriogram they had problems cannulating the left groin. There is no femoral pulse on the right. I've recommended that we proceed with arteriography. I have reviewed with the patient the indications for arteriography. In addition, I have reviewed the potential complications of arteriography including but not limited to: Bleeding, arterial injury, arterial thrombosis, dye action, renal insufficiency, or other unpredictable medical problems. I have explained to the patient that if we find disease amenable to angioplasty we could potentially address this at the same time. I have discussed the potential complications of angioplasty and stenting, including but not limited to: Bleeding, arterial thrombosis, arterial injury, dissection, or the need for surgical intervention. In addition, given  that he may have had previous issues in cannulating the left groin in the past there is a small chance that we were not successful he would require a CT angiogram although I do not think that this would give us the detail that we'll get with an arteriogram. This procedure scheduled for 12/27/2013. We will make further recommendations pending these results.    Chuck Hinthristopher S Dickson MD [facility]

## 2013-12-22 NOTE — Assessment & Plan Note (Signed)
This patient presents with disabling claudication of the right lower extremity. He feels that his symptoms are severe. He states he's had previous stents but does not remember the details I cannot find the previous records. He does state that on her previous arteriogram they had problems cannulating the left groin. There is no femoral pulse on the right. I've recommended that we proceed with arteriography. I have reviewed with the patient the indications for arteriography. In addition, I have reviewed the potential complications of arteriography including but not limited to: Bleeding, arterial injury, arterial thrombosis, dye action, renal insufficiency, or other unpredictable medical problems. I have explained to the patient that if we find disease amenable to angioplasty we could potentially address this at the same time. I have discussed the potential complications of angioplasty and stenting, including but not limited to: Bleeding, arterial thrombosis, arterial injury, dissection, or the need for surgical intervention. In addition, given that he may have had previous issues in cannulating the left groin in the past there is a small chance that we were not successful he would require a CT angiogram although I do not think that this would give us the detail that we'll get with an arteriogram. This procedure scheduled for 12/27/2013. We will make further recommendations pending these results.

## 2013-12-27 ENCOUNTER — Encounter (HOSPITAL_COMMUNITY): Payer: Self-pay | Admitting: General Practice

## 2013-12-27 ENCOUNTER — Other Ambulatory Visit: Payer: Self-pay | Admitting: *Deleted

## 2013-12-27 ENCOUNTER — Encounter (HOSPITAL_COMMUNITY)
Admission: RE | Disposition: A | Discharge status: Home / Self Care | Payer: Self-pay | Source: Ambulatory Visit | Attending: Vascular Surgery

## 2013-12-27 ENCOUNTER — Ambulatory Visit (HOSPITAL_COMMUNITY)
Admission: RE | Admit: 2013-12-27 | Discharge: 2013-12-28 | Disposition: A | Discharge status: Home / Self Care | Payer: PRIVATE HEALTH INSURANCE | Source: Ambulatory Visit | Attending: Vascular Surgery | Admitting: Vascular Surgery

## 2013-12-27 DIAGNOSIS — Z48812 Encounter for surgical aftercare following surgery on the circulatory system: Secondary | ICD-10-CM

## 2013-12-27 DIAGNOSIS — I739 Peripheral vascular disease, unspecified: Secondary | ICD-10-CM

## 2013-12-27 DIAGNOSIS — I70219 Atherosclerosis of native arteries of extremities with intermittent claudication, unspecified extremity: Secondary | ICD-10-CM | POA: Insufficient documentation

## 2013-12-27 DIAGNOSIS — F17200 Nicotine dependence, unspecified, uncomplicated: Secondary | ICD-10-CM | POA: Insufficient documentation

## 2013-12-27 DIAGNOSIS — Z9861 Coronary angioplasty status: Secondary | ICD-10-CM | POA: Insufficient documentation

## 2013-12-27 DIAGNOSIS — Z7982 Long term (current) use of aspirin: Secondary | ICD-10-CM | POA: Insufficient documentation

## 2013-12-27 DIAGNOSIS — I2510 Atherosclerotic heart disease of native coronary artery without angina pectoris: Secondary | ICD-10-CM | POA: Insufficient documentation

## 2013-12-27 HISTORY — DX: Atherosclerotic heart disease of native coronary artery without angina pectoris: I25.10

## 2013-12-27 HISTORY — PX: ILIAC ARTERY STENT: SHX1786

## 2013-12-27 HISTORY — DX: Headache: R51

## 2013-12-27 HISTORY — DX: Nausea with vomiting, unspecified: R11.2

## 2013-12-27 HISTORY — PX: ABDOMINAL AORTAGRAM: SHX5454

## 2013-12-27 HISTORY — DX: Family history of other specified conditions: Z84.89

## 2013-12-27 HISTORY — DX: Adverse effect of unspecified anesthetic, initial encounter: T41.45XA

## 2013-12-27 HISTORY — DX: Other specified postprocedural states: Z98.890

## 2013-12-27 HISTORY — DX: Other complications of anesthesia, initial encounter: T88.59XA

## 2013-12-27 LAB — POCT I-STAT, CHEM 8
BUN: 13 mg/dL (ref 6–23)
CALCIUM ION: 1.17 mmol/L (ref 1.12–1.23)
CREATININE: 0.9 mg/dL (ref 0.50–1.35)
Chloride: 101 mEq/L (ref 96–112)
GLUCOSE: 98 mg/dL (ref 70–99)
HCT: 43 % (ref 39.0–52.0)
HEMOGLOBIN: 14.6 g/dL (ref 13.0–17.0)
Potassium: 3.8 mEq/L (ref 3.7–5.3)
SODIUM: 142 meq/L (ref 137–147)
TCO2: 23 mmol/L (ref 0–100)

## 2013-12-27 LAB — POCT ACTIVATED CLOTTING TIME
ACTIVATED CLOTTING TIME: 197 s
ACTIVATED CLOTTING TIME: 163 s
Activated Clotting Time: 186 seconds

## 2013-12-27 SURGERY — ABDOMINAL AORTAGRAM
Anesthesia: LOCAL | Laterality: Right

## 2013-12-27 MED ORDER — ONDANSETRON HCL 4 MG PO TABS: 4.0000 mg | ORAL_TABLET | Freq: Once | ORAL | Status: AC

## 2013-12-27 MED ORDER — ONDANSETRON HCL 4 MG/2ML IJ SOLN: 4.0000 mg | Freq: Four times a day (QID) | INTRAMUSCULAR | Status: DC

## 2013-12-27 MED ORDER — OXYCODONE-ACETAMINOPHEN 5-325 MG PO TABS: 1.0000 | ORAL_TABLET | ORAL | Status: DC | PRN

## 2013-12-27 MED ORDER — HEPARIN SODIUM (PORCINE) 1000 UNIT/ML IJ SOLN
INTRAMUSCULAR | Status: AC
  Filled 2013-12-27: qty 1

## 2013-12-27 MED ORDER — HYDRALAZINE HCL 20 MG/ML IJ SOLN
INTRAMUSCULAR | Status: AC
  Filled 2013-12-27: qty 1

## 2013-12-27 MED ORDER — SODIUM CHLORIDE 0.9 % IV SOLN: 1.0000 mL/kg/h | INTRAVENOUS | Status: AC

## 2013-12-27 MED ORDER — HYDROCODONE-ACETAMINOPHEN 5-325 MG PO TABS
1.0000 | ORAL_TABLET | ORAL | Status: DC | PRN
  Administered 2013-12-27: 16:00:00 1 via ORAL
  Filled 2013-12-27: qty 1

## 2013-12-27 MED ORDER — SODIUM CHLORIDE 0.9 % IV SOLN
1.0000 mL/kg/h | INTRAVENOUS | Status: DC
  Administered 2013-12-27: 14:00:00 1 mL/kg/h via INTRAVENOUS

## 2013-12-27 MED ORDER — CLOPIDOGREL BISULFATE 75 MG PO TABS
ORAL_TABLET | ORAL | Status: AC
  Filled 2013-12-27: qty 1

## 2013-12-27 MED ORDER — MORPHINE SULFATE 2 MG/ML IJ SOLN: 2.0000 mg | INTRAMUSCULAR | Status: DC | PRN

## 2013-12-27 MED ORDER — ONDANSETRON HCL 4 MG/2ML IJ SOLN
4.0000 mg | Freq: Four times a day (QID) | INTRAMUSCULAR | Status: DC | PRN
  Administered 2013-12-27: 4 mg via INTRAVENOUS
  Filled 2013-12-27: qty 2

## 2013-12-27 MED ORDER — FENTANYL CITRATE 0.05 MG/ML IJ SOLN
INTRAMUSCULAR | Status: AC
  Filled 2013-12-27: qty 2

## 2013-12-27 MED ORDER — HEPARIN (PORCINE) IN NACL 2-0.9 UNIT/ML-% IJ SOLN
INTRAMUSCULAR | Status: AC
  Filled 2013-12-27: qty 1000

## 2013-12-27 MED ORDER — MORPHINE SULFATE 2 MG/ML IJ SOLN
INTRAMUSCULAR | Status: AC
  Administered 2013-12-27: 2 mg via INTRAVENOUS
  Filled 2013-12-27: qty 1

## 2013-12-27 MED ORDER — CLOPIDOGREL BISULFATE 75 MG PO TABS
ORAL_TABLET | ORAL | Status: AC
Start: 2013-12-27 — End: 2013-12-27
  Filled 2013-12-27: qty 1

## 2013-12-27 MED ORDER — HYDRALAZINE HCL 20 MG/ML IJ SOLN: 10.0000 mg | INTRAMUSCULAR | Status: DC | PRN

## 2013-12-27 MED ORDER — MIDAZOLAM HCL 2 MG/2ML IJ SOLN
INTRAMUSCULAR | Status: AC
  Filled 2013-12-27: qty 2

## 2013-12-27 MED ORDER — ONDANSETRON HCL 4 MG/2ML IJ SOLN
INTRAMUSCULAR | Status: AC
  Administered 2013-12-27: 4 mg
  Filled 2013-12-27: qty 2

## 2013-12-27 MED ORDER — ALPRAZOLAM 0.5 MG PO TABS
0.5000 mg | ORAL_TABLET | Freq: Once | ORAL | Status: AC
  Administered 2013-12-27: 0.5 mg via ORAL
  Filled 2013-12-27: qty 1

## 2013-12-27 MED ORDER — CLOPIDOGREL BISULFATE 75 MG PO TABS: 75.0000 mg | ORAL_TABLET | Freq: Every day | ORAL | Status: DC

## 2013-12-27 MED ORDER — SODIUM CHLORIDE 0.9 % IV SOLN
INTRAVENOUS | Status: DC
  Administered 2013-12-27: 07:00:00 via INTRAVENOUS

## 2013-12-27 MED ORDER — LIDOCAINE HCL (PF) 1 % IJ SOLN
INTRAMUSCULAR | Status: AC
  Filled 2013-12-27: qty 30

## 2013-12-27 NOTE — H&P (View-Only) (Signed)
Patient ID: Jacob Duncan, male   DOB: February 27, 1966, 48 y.o.   MRN: [id number]  Reason for Consult: PVD   Right lower extremity claudication. Referred by the emergency department.  Referred by No ref. provider found ED  Subjective:     HPI:  Jacob Duncan is a 48 y.o. male who was evaluated in the emergency department on 12/20/2013 with right lower extremity claudication. In the emergency department ABI on the right was 66% on the left 79%. Dr. Arbie CookeyEarly arrange for an outpatient visit in the office for the patient. The patient states that he's had 3 previous stents. However, I am unable to find any records on EPIC of these procedures.  He experiences pain in the right calf which is brought on by ambulation and relieved with rest. This occurs a short distance. He does not remember any acute onset of symptoms. His symptoms have gradually progressed. He describes the symptoms as severe. He denies any rest pain although he does get cramps in his right lower extremity at night. He denies any history of nonhealing wounds.  He does have a history of 2 packs per day of smoking but is currently trying to quit. He denies any history of diabetes, hypertension, hypercholesterolemia, or history of premature cardiovascular disease.  Past Medical History  Diagnosis Date  . Peripheral vascular disease    Family History  Problem Relation Age of Onset  . Deep vein thrombosis Mother    Past Surgical History  Procedure Laterality Date  . Coronary angioplasty with stent placement     Short Social History:  History  Substance Use Topics  . Smoking status: Former Smoker -- 1.50 packs/day    Quit date: 12/01/2013  . Smokeless tobacco: Never Used  . Alcohol Use: No   Allergies  Allergen Reactions  . Codeine     " room starts spinning"   Current Outpatient Prescriptions  Medication Sig Dispense Refill  . aspirin 81 MG chewable tablet Chew 81 mg by mouth daily.       Marland Kitchen. ibuprofen  (ADVIL,MOTRIN) 200 MG tablet Take 400 mg by mouth every 6 (six) hours as needed for moderate pain.       No current facility-administered medications for this visit.   Review of Systems  Constitutional: Negative for chills and fever.  Eyes: Negative for loss of vision.  Respiratory: Negative for cough and wheezing.  Cardiovascular: Positive for claudication. Negative for chest pain, chest tightness, dyspnea with exertion, orthopnea and palpitations.  GI: Negative for blood in stool and vomiting.  GU: Negative for dysuria and hematuria.  Musculoskeletal: Negative for leg pain, joint pain and myalgias.  Skin: Negative for rash and wound.  Neurological: Positive for numbness. Negative for dizziness and speech difficulty.  Hematologic: Negative for bruises/bleeds easily. Psychiatric: Negative for depressed mood.        Objective:  Objective  Filed Vitals:   12/22/13 1246  BP: 143/87  Pulse: 98  Resp: 14  Height: 5\' 11"  (1.803 m)  Weight: 146 lb (66.225 kg)   Body mass index is 20.37 kg/(m^2).  Physical Exam  Constitutional: He is oriented to person, place, and time. He appears well-developed and well-nourished.  HENT:  Head: Normocephalic and atraumatic.  Neck: Neck supple. No JVD present. No thyromegaly present.  Cardiovascular: Normal rate, regular rhythm, S1 normal, S2 normal and normal heart sounds.  Exam reveals no friction rub.   No murmur heard. Pulses:      Femoral pulses are 0 on  the right side, and 1+ on the left side.      Popliteal pulses are 0 on the right side, and 2+ on the left side.       Dorsalis pedis pulses are 0 on the right side, and 0 on the left side.       Posterior tibial pulses are 0 on the right side, and 2+ on the left side.  Pulmonary/Chest: Breath sounds normal. He has no wheezes. He has no rales.  Abdominal: Soft. Bowel sounds are normal. There is no tenderness.  I do not palpate an aneurysm.  Musculoskeletal: Normal range of motion. He  exhibits no edema.  Lymphadenopathy:    He has no cervical adenopathy.  Neurological: He is alert and oriented to person, place, and time. He has normal strength. No sensory deficit.  Skin: No lesion and no rash noted.  Psychiatric: He has a normal mood and affect.   Data: I have reviewed his records from the emergency department. In the emergency department ABI on the right was 66%. ABI on the left was 79%.  I have independently interpreted his arterial Doppler study in our office today which shows monophasic Doppler wave forms from the common femoral artery throughout the entire right lower extremity. ABI on the right is 59%. In our office today the ABI on the left was 100%. He was noted to have triphasic Doppler signals in the dorsalis pedis and posterior tibial positions on the left.      Assessment/Plan:    Atherosclerotic PVD with intermittent claudication This patient presents with disabling claudication of the right lower extremity. He feels that his symptoms are severe. He states he's had previous stents but does not remember the details I cannot find the previous records. He does state that on her previous arteriogram they had problems cannulating the left groin. There is no femoral pulse on the right. I've recommended that we proceed with arteriography. I have reviewed with the patient the indications for arteriography. In addition, I have reviewed the potential complications of arteriography including but not limited to: Bleeding, arterial injury, arterial thrombosis, dye action, renal insufficiency, or other unpredictable medical problems. I have explained to the patient that if we find disease amenable to angioplasty we could potentially address this at the same time. I have discussed the potential complications of angioplasty and stenting, including but not limited to: Bleeding, arterial thrombosis, arterial injury, dissection, or the need for surgical intervention. In addition, given  that he may have had previous issues in cannulating the left groin in the past there is a small chance that we were not successful he would require a CT angiogram although I do not think that this would give us the detail that we'll get with an arteriogram. This procedure scheduled for 12/27/2013. We will make further recommendations pending these results.    Chuck Hinthristopher S Dickson MD [facility]

## 2013-12-27 NOTE — Care Management Note (Addendum)
  Page 2 of 2   12/28/2013     10:54:11 AM CARE MANAGEMENT NOTE 12/28/2013  Patient:  Jacob Duncan,[name]   Account Number:  [id number]  Date Initiated:  12/27/2013  Documentation initiated by:  Donato SchultzHUTCHINSON,CRYSTAL  Subjective/Objective Assessment:   ABDOMINAL AORTAGRAM     Action/Plan:   CM to follow for disposition needs   Anticipated DC Date:  12/28/2013   Anticipated DC Plan:  HOME/SELF CARE      DC Planning Services  CM consult  [facility] Program  [facility] (established[facility]new)      PAC Choice  NA   Choice offered to / List presented to:             Status of service:  Completed, signed off Medicare Important Message given?   (If response is "NO", the following Medicare IM given date fields will be blank) Date Medicare IM given:   Medicare IM given by:   Date Additional Medicare IM given:   Additional Medicare IM given by:    Discharge Disposition:  HOME/SELF CARE  Per UR Regulation:    If discussed at Long Length of Stay Meetings, dates discussed:    Comments:  Crystal Hutchinson RN, BSN, MSHL, CCM  Nurse - Case Manager, (Unit [phone])  [phone]  12/28/2013 From home / living [place] at this time in [place], [place].    Patient drives and understands he will not be able to drive self over the next 5 days and will need supportive transportation asssitance during this time.  Supportive mother at bedside. Self pay:  MC F.A. consult with patient completed at bedside Medication Assistance:  CM [facility], [place] [phone] for cost of Plavix/generic - $30.00 for 30 day supply.  Patient states unable to afford to self-pay for Plavix.  MATCH Resource Letter provided to patient. CM provided additional resource of hard sample copy of $4.00 med list options.  Instructed to take to MD appt's for review for future needs.  Patient confirmed [facility]. [employer] Card application provided to patient with instructions; CM advised to  [facility] and SW assistance with processing form.  Patient confirmed he would go by tomorrow. PCP:  None - Patient [facility] consideration for other no show appts.   CM encouraged to attempt to take what ever appt available d/t difficulty getting appts d/t [facility] schedules [facility]. Disposition:  Home/self care with PCP/Med assitance resource and Med MATCH   Crystal Hutchinson RN, BSN, MSHL, CCM  Nurse - Case Manager, (Unit [phone])  [phone]  12/27/2013 From home alone. Med Review: Plavix

## 2013-12-27 NOTE — Progress Notes (Signed)
Site area:right  groin  Site Prior to Removal:  Level 0 rt groin  Pressure Applied For 20 MINUTES    Minutes Beginning at 1145  Manual:  yes  Patient Status During Pull:  stable  Post Pull Groin Site:  Level 0  Post Pull Instructions Given:yes   Post Pull Pulses Present: yes   Dressing Applied:yes rt groin    Comments:  tegaderm  Dressing applied to rt groin

## 2013-12-27 NOTE — Progress Notes (Signed)
ACT 163 

## 2013-12-27 NOTE — Progress Notes (Addendum)
Rt groin not bleeding and level O and pressured held for additional 10 minutes

## 2013-12-27 NOTE — Interval H&P Note (Signed)
History and Physical Interval Note:  12/27/2013 8:35 AM  Jacob Duncan  has presented today for surgery, with the diagnosis of claudication  The various methods of treatment have been discussed with the patient and family. After consideration of risks, benefits and other options for treatment, the patient has consented to  Procedure(s): ABDOMINAL AORTAGRAM (N/A) as a surgical intervention .  The patient's history has been reviewed, patient examined, no change in status, stable for surgery.  I have reviewed the patient's chart and labs.  Questions were answered to the patient's satisfaction.     DICKSON,CHRISTOPHER S

## 2013-12-27 NOTE — Progress Notes (Signed)
Site area:left  groin  Site Prior to Removal:  Level 0 left groin   Pressure Applied For 20 MINUTES    Minutes Beginning at 1230  Manual: yes   Patient Status During Pull:  stable  Post Pull Groin Site:  Level 0  Post Pull Instructions Given:  yes Post Pull Pulses Present:  yes Dressing Applied:yes Comments: Bedrest begins at 1250

## 2013-12-27 NOTE — Progress Notes (Signed)
Rt groin site level o and redressed

## 2013-12-27 NOTE — Progress Notes (Signed)
Nauseated and threw up small amt light green emesis. Cool cloth to forehead.

## 2013-12-27 NOTE — Brief Op Note (Signed)
Pt coughed and rt groin rebleed, dressing removed and pressure held

## 2013-12-27 NOTE — Op Note (Signed)
PATIENT: Jacob Duncan      MRN: [id number] DOB: 1966/01/10    DATE OF PROCEDURE: 12/27/2013  INDICATIONS: Jacob Duncan is a 48 y.o. male Who presented with progressive ischemia of his right lower extremity. He had no femoral pulse on the right. He is brought in for arteriography and possible angioplasty and stenting.  PROCEDURE:  1. Ultrasound-guided access of the left common femoral artery 2. Aortogram with bilateral iliac arteriogram 3. Ultrasound-guided access to the right common femoral artery 4. Angioplasty and stenting of an occluded right common iliac artery using a 7 mm x 6 cm self-expanding stent. (Post dilatation with a 6 mm x 6 cm balloon) 5. Bilateral lower extremity runoff  SURGEON: Di Kindlehristopher S. Edilia Boickson, MD, FACS  ANESTHESIA: local with sedation   EBL: minimal  TECHNIQUE: The patient was brought to the peripheral vascular lab and received 1 mg of Versed and 50 mcg of fentanyl. Both groins were prepped and draped in usual sterile fashion. After the skin was anesthetized with 1% lidocaine, under ultrasound guidance, the left common iliac artery was cannulated with a micropuncture needle and a micropuncture sheath introduced over the wire. This was exchanged for a 5 [place] sheath over a Versacore wire. A pigtail catheter was positioned at the L1 vertebral body of a short arm obtained. The catheter was then positioned above the aortic bifurcation and oblique iliac projections were obtained. 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 I elected to address this with angioplasty and stenting.  On the right side, after the skin was anesthetized, under ultrasound guidance, the right common femoral artery was cannulated with the micropuncture needle and micropuncture sheath introduced over the wire. This was exchanged for a 5 French sheath over a versacore wire. 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 The endhole catheter was advanced over the wire into the aorta. The wire was removed and there was good blood return from the endhole catheter confirming position within the distal aorta. The wire was then advanced and the end hole catheter was exchanged for a pigtail catheter. A film was obtained to demonstrate the position of the occlusion and the stent was selected. The patient received 5000 units of IV heparin and received an additional thousand units of IV heparin as the initial ACT was 182. The 5 French sheath was exchanged for a long 7 [place] sheath. After the heparin had circulated, these 7 mm x 6 cm self-expanding stent was positioned across the occlusion and deployed without difficulty. This was then removed and a 6 mm x 6 cm balloon was positioned within the in-stent and inflated to nominal pressure (6 atmospheres) for 1 minute. Completion film showed good result with no residual stenosis noted. Next bilateral lower extremity runoff film was obtained.  At the completion of the procedure, the pigtail catheter and wires were removed. The sheaths were flushed. The patient was transferred to the holding area for removal of the sheaths.  FINDINGS:  1. There are single renal arteries bilaterally with no significant renal artery stenosis identified. The infrarenal aorta is patent with no significant stenosis noted. 2. 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 3. On the right side, the common femoral, superficial  femoral, deep femoral, popliteal, anterior tibial, posterior tibial, and proximal peroneal arteries are patent. The posterior artery is patent onto the foot. There is poor visualization of the right dorsalis pedis artery distally. The peroneal artery is  occluded and mid calf. 4. On the left side, there is a mild 20% stenosis in the proximal left common iliac artery but there was no gradient across this at rest. The left common iliac, hypogastric, external iliac arteries are patent. The left common femoral, deep femoral, superficial femoral, popliteal, anterior tibial, posterior tibial, and peroneal arteries are patent on the left.  Jacob Ferrarihristopher Srija Southard, MD, FACS [facility]  DATE OF DICTATION:   12/27/2013

## 2013-12-28 ENCOUNTER — Telehealth: Payer: Self-pay | Admitting: Vascular Surgery

## 2013-12-28 NOTE — Telephone Encounter (Addendum)
Message copied by Fredrich BirksMILLIKAN, DANA P on Tue Dec 28, 2013  1:31 PM ------      Message from: Lorin MercyMCCHESNEY, MARILYN K      Created: Mon Dec 27, 2013  3:20 PM      Regarding: Schedule                   ----- Message -----         From: Chuck Hinthristopher S Dickson, MD         Sent: 12/27/2013  10:38 AM           To: [facility] Charge Pool      Subject: charge and f/u                                           PROCEDURE:       1. Ultrasound-guided access of the left common femoral artery      2. Aortogram with bilateral iliac arteriogram      3. Ultrasound-guided access to the right common femoral artery      4. Angioplasty and stenting of an occluded right common iliac artery using a 7 mm x 6 cm self-expanding stent. (Post dilatation with a 6 mm x 6 cm balloon)      5. Bilateral lower extremity runoff            SURGEON: Di Kindlehristopher S. Edilia Boickson, MD, FACS            He'll need a follow up in 6 weeks with ABIs at that time. Thank you. CD ------  12/28/13: spoke with pt, dpm

## 2013-12-28 NOTE — Discharge Summary (Signed)
Patient ID: Jacob Duncan MRN: [id number] DOB/AGE: 47-Sep-1967 47 y.o.  Admit date: 12/27/2013 Discharge date: 12/28/2013  Admission Diagnosis: claudication  Discharge Diagnoses:  claudication  Secondary Diagnoses: Past Medical History  Diagnosis Date  . Peripheral vascular disease   . Complication of anesthesia   . PONV (postoperative nausea and vomiting)   . Family history of anesthesia complication     " MOTHER HAS NAUSEA ALSO "  . Coronary artery disease   . TIRWERXV(400.8)    Procedures: 12/27/2013 Surgeon(s): Angelia Mould, MD Procedure(s): ABDOMINAL Maxcine Ham PERCUTANEOUS STENT INTERVENTION Right  Discharged Condition: good  HPI: Jacob Duncan is a 48 y.o. male who was evaluated in the emergency department on 12/20/2013 with right lower extremity claudication. In the emergency department ABI on the right was 66% on the left 79%. Dr. Donnetta Hutching arrange for an outpatient visit in the office for the patient. The patient states that he's had 3 previous stents. However, I am unable to find any records on EPIC of these procedures.  He experiences pain in the right calf which is brought on by ambulation and relieved with rest. This occurs a short distance. He does not remember any acute onset of symptoms. His symptoms have gradually progressed. He describes the symptoms as severe. He denies any rest pain although he does get cramps in his right lower extremity at night. He denies any history of nonhealing wounds.  He does have a history of 2 packs per day of smoking but is currently trying to quit. He denies any history of diabetes, hypertension, hypercholesterolemia, or history of premature cardiovascular disease.   Hospital Course: Pt underwent PTA/Stent of a R CIA occlusion and was kept overnight for observation.   Consults:     Significant Diagnostic Studies:  No results found.  CBC    Component Value Date/Time   WBC 6.7 06/06/2010 0205   RBC 4.15*  06/06/2010 0205   HGB 14.6 12/27/2013 0729   HCT 43.0 12/27/2013 0729   PLT 185 06/06/2010 0205   MCV 91.6 06/06/2010 0205   MCH 31.1 06/06/2010 0205   MCHC 33.9 06/06/2010 0205   RDW 13.4 06/06/2010 0205   LYMPHSABS 1.6 05/26/2010 0845   MONOABS 0.4 05/26/2010 0845   EOSABS 0.1 05/26/2010 0845   BASOSABS 0.0 05/26/2010 0845    BMET    Component Value Date/Time   NA 142 12/27/2013 0729   K 3.8 12/27/2013 0729   CL 101 12/27/2013 0729   CO2 25 06/06/2010 0154   GLUCOSE 98 12/27/2013 0729   BUN 13 12/27/2013 0729   CREATININE 0.90 12/27/2013 0729   CALCIUM 8.5 06/06/2010 0154   GFRNONAA >60 06/06/2010 0154   GFRAA  Value: >60        The eGFR has been calculated using the MDRD equation. This calculation has not been validated in all clinical situations. eGFR's persistently <60 mL/min signify possible Chronic Kidney Disease. 06/06/2010 0154    COAG Lab Results  Component Value Date   INR 0.93 05/26/2010   No results found for this basename: PTT    Disposition: 01-Home or Self Care  Discharge Instructions   Call MD for:  redness, tenderness, or signs of infection (pain, swelling, bleeding, redness, odor or green/yellow discharge around incision site)    Complete by:  As directed      Call MD for:  severe or increased pain, loss or decreased feeling  in affected limb(s)    Complete by:  As directed  Call MD for:  temperature >100.5    Complete by:  As directed      Discharge instructions    Complete by:  As directed   Office will call to arrange a follow up visit in 6 weeks. Gradually resume activity. No heavy lifting for 2 weeks. Call if any problems. [phone].     Lifting restrictions    Complete by:  As directed   No lifting for 2 weeks     Resume previous diet    Complete by:  As directed             Medication List         aspirin 81 MG chewable tablet  Chew 81 mg by mouth daily.     clopidogrel 75 MG tablet  Commonly known as:  PLAVIX  Take 1 tablet (75 mg total)  by mouth daily.     ibuprofen 200 MG tablet  Commonly known as:  ADVIL,MOTRIN  Take 400 mg by mouth every 6 (six) hours as needed for moderate pain.         Signed: DICKSON,CHRISTOPHER S 12/28/2013, 7:28 AM

## 2013-12-28 NOTE — Progress Notes (Signed)
   VASCULAR PROGRESS NOTE  SUBJECTIVE: No complaints  PHYSICAL EXAM: Filed Vitals:   12/27/13 1905 12/27/13 2055 12/28/13 0010 12/28/13 0607  BP: 119/67 109/63 122/68 111/74  Pulse: 54 60 54 61  Temp:  97.6 F (36.4 C)  97.4 F (36.3 C)  TempSrc:  Oral  Oral  Resp:   16 18  Height:      Weight:    149 lb 14.6 oz (68 kg)  SpO2: 100% 99% 98% 100%   Palpable pedal pulses on right foot Groins look fine.  Active Problems:   PVD (peripheral vascular disease)  ASSESSMENT AND PLAN:  * 1 Day Post-Op s/p: PTA/Stent for R CIA occlusion  *  Home today.  Cari CarawayChris Dickson Beeper: [phone]: [phone] 12/28/2013

## 2014-01-03 ENCOUNTER — Emergency Department (HOSPITAL_COMMUNITY)
Admission: EM | Admit: 2014-01-03 | Discharge: 2014-01-03 | Disposition: A | Discharge status: Home / Self Care | Payer: PRIVATE HEALTH INSURANCE | Attending: Emergency Medicine | Admitting: Emergency Medicine

## 2014-01-03 ENCOUNTER — Encounter (HOSPITAL_COMMUNITY): Payer: Self-pay | Admitting: Emergency Medicine

## 2014-01-03 ENCOUNTER — Emergency Department (HOSPITAL_COMMUNITY)
Admission: EM | Admit: 2014-01-03 | Discharge: 2014-01-03 | Discharge status: Against Medical Advice | Payer: PRIVATE HEALTH INSURANCE | Attending: Emergency Medicine | Admitting: Emergency Medicine

## 2014-01-03 DIAGNOSIS — IMO0002 Reserved for concepts with insufficient information to code with codable children: Secondary | ICD-10-CM | POA: Insufficient documentation

## 2014-01-03 DIAGNOSIS — R52 Pain, unspecified: Secondary | ICD-10-CM | POA: Insufficient documentation

## 2014-01-03 DIAGNOSIS — Y849 Medical procedure, unspecified as the cause of abnormal reaction of the patient, or of later complication, without mention of misadventure at the time of the procedure: Secondary | ICD-10-CM | POA: Insufficient documentation

## 2014-01-03 DIAGNOSIS — I2510 Atherosclerotic heart disease of native coronary artery without angina pectoris: Secondary | ICD-10-CM | POA: Insufficient documentation

## 2014-01-03 DIAGNOSIS — Z7902 Long term (current) use of antithrombotics/antiplatelets: Secondary | ICD-10-CM | POA: Insufficient documentation

## 2014-01-03 DIAGNOSIS — Z87891 Personal history of nicotine dependence: Secondary | ICD-10-CM | POA: Insufficient documentation

## 2014-01-03 DIAGNOSIS — M79609 Pain in unspecified limb: Secondary | ICD-10-CM | POA: Insufficient documentation

## 2014-01-03 DIAGNOSIS — Z7982 Long term (current) use of aspirin: Secondary | ICD-10-CM | POA: Insufficient documentation

## 2014-01-03 DIAGNOSIS — G8918 Other acute postprocedural pain: Secondary | ICD-10-CM | POA: Insufficient documentation

## 2014-01-03 DIAGNOSIS — I739 Peripheral vascular disease, unspecified: Secondary | ICD-10-CM | POA: Insufficient documentation

## 2014-01-03 DIAGNOSIS — Z79899 Other long term (current) drug therapy: Secondary | ICD-10-CM | POA: Insufficient documentation

## 2014-01-03 LAB — CBC WITH DIFFERENTIAL/PLATELET
BASOS PCT: 1 % (ref 0–1)
Basophils Absolute: 0.1 10*3/uL (ref 0.0–0.1)
Eosinophils Absolute: 0.2 10*3/uL (ref 0.0–0.7)
Eosinophils Relative: 3 % (ref 0–5)
HCT: 40.7 % (ref 39.0–52.0)
HEMOGLOBIN: 14 g/dL (ref 13.0–17.0)
LYMPHS ABS: 2.1 10*3/uL (ref 0.7–4.0)
Lymphocytes Relative: 36 % (ref 12–46)
MCH: 31.6 pg (ref 26.0–34.0)
MCHC: 34.4 g/dL (ref 30.0–36.0)
MCV: 91.9 fL (ref 78.0–100.0)
MONO ABS: 0.6 10*3/uL (ref 0.1–1.0)
Monocytes Relative: 11 % (ref 3–12)
Neutro Abs: 2.7 10*3/uL (ref 1.7–7.7)
Neutrophils Relative %: 49 % (ref 43–77)
PLATELETS: 217 10*3/uL (ref 150–400)
RBC: 4.43 MIL/uL (ref 4.22–5.81)
RDW: 13.3 % (ref 11.5–15.5)
WBC: 5.7 10*3/uL (ref 4.0–10.5)

## 2014-01-03 LAB — BASIC METABOLIC PANEL
ANION GAP: 11 (ref 5–15)
Anion gap: 13 (ref 5–15)
BUN: 13 mg/dL (ref 6–23)
BUN: 14 mg/dL (ref 6–23)
CALCIUM: 9.8 mg/dL (ref 8.4–10.5)
CHLORIDE: 100 meq/L (ref 96–112)
CO2: 27 mEq/L (ref 19–32)
CO2: 27 meq/L (ref 19–32)
CREATININE: 0.96 mg/dL (ref 0.50–1.35)
Calcium: 9.7 mg/dL (ref 8.4–10.5)
Chloride: 101 mEq/L (ref 96–112)
Creatinine, Ser: 1.01 mg/dL (ref 0.50–1.35)
GFR calc Af Amer: >90 mL/min (ref >90)
GFR calc Af Amer: >90 mL/min (ref >90)
GFR calc non Af Amer: 87 mL/min — ABNORMAL LOW (ref >90)
GFR calc non Af Amer: >90 mL/min (ref >90)
GLUCOSE: 99 mg/dL (ref 70–99)
Glucose, Bld: 106 mg/dL — ABNORMAL HIGH (ref 70–99)
POTASSIUM: 4.8 meq/L (ref 3.7–5.3)
Potassium: 4.6 mEq/L (ref 3.7–5.3)
Sodium: 138 mEq/L (ref 137–147)
Sodium: 141 mEq/L (ref 137–147)

## 2014-01-03 LAB — CBC
HEMATOCRIT: 41.4 % (ref 39.0–52.0)
Hemoglobin: 14 g/dL (ref 13.0–17.0)
MCH: 31.6 pg (ref 26.0–34.0)
MCHC: 33.8 g/dL (ref 30.0–36.0)
MCV: 93.5 fL (ref 78.0–100.0)
Platelets: 227 10*3/uL (ref 150–400)
RBC: 4.43 MIL/uL (ref 4.22–5.81)
RDW: 13.4 % (ref 11.5–15.5)
WBC: 6.3 10*3/uL (ref 4.0–10.5)

## 2014-01-03 LAB — PROTIME-INR
INR: 0.92 (ref 0.00–1.49)
PROTHROMBIN TIME: 12.4 s (ref 11.6–15.2)

## 2014-01-03 LAB — APTT: aPTT: 32 seconds (ref 24–37)

## 2014-01-03 LAB — I-STAT CG4 LACTIC ACID, ED: Lactic Acid, Venous: 1.61 mmol/L (ref 0.5–2.2)

## 2014-01-03 LAB — CK: Total CK: 62 U/L (ref 7–232)

## 2014-01-03 MED ORDER — HYDROCODONE-ACETAMINOPHEN 5-325 MG PO TABS: 2.0000 | ORAL_TABLET | ORAL | Status: DC | PRN

## 2014-01-03 MED ORDER — SODIUM CHLORIDE 0.9 % IV BOLUS (SEPSIS)
500.0000 mL | Freq: Once | INTRAVENOUS | Status: AC
  Administered 2014-01-03: 500 mL via INTRAVENOUS

## 2014-01-03 NOTE — ED Notes (Signed)
He just discharged after "artery surgery in my legs and my stomach and my back." He states since the surgery hes noticed bruises at sites have gotten increasingly worse and "theres some kind of weird black line running across my belly."

## 2014-01-03 NOTE — ED Provider Notes (Signed)
CSN: [id number]     Arrival date & time 01/03/14  1705 History   First MD Initiated Contact with Patient 01/03/14 1806     Chief Complaint  Patient presents with  . Post-op Problem     (Consider location/radiation/quality/duration/timing/severity/associated sxs/prior Treatment) HPI Comments: Patient presents to the ER for evaluation pain and bruising of his leg. Patient underwent angioplasty and stenting of right common iliac artery on June 29. Patient reports that he is still noticing bruising in his right groin. Additionally he is having pain in the leg when he walks. Pain is sharp, shooting pain that is at times burning in sensation. It worsens when he walks. He is taking his Plavix. He was prescribed ibuprofen for the pain but it is not helping.   Past Medical History  Diagnosis Date  . Peripheral vascular disease   . Complication of anesthesia   . PONV (postoperative nausea and vomiting)   . Family history of anesthesia complication     " MOTHER HAS NAUSEA ALSO "  . Coronary artery disease   . [id number](478.2Headache(784.0)    Past Surgical History  Procedure Laterality Date  . Coronary angioplasty with stent placement    . Iliac artery stent Right 12/27/2013    dr Edilia Bodickson   Family History  Problem Relation Age of Onset  . Deep vein thrombosis Mother    History  Substance Use Topics  . Smoking status: Former Smoker -- 1.50 packs/day for 20 years    Quit date: 12/01/2013  . Smokeless tobacco: Never Used  . Alcohol Use: No    Review of Systems  Musculoskeletal: Negative for back pain.  Skin:       Bruising  All other systems reviewed and are negative.     Allergies  Codeine  Home Medications   Prior to Admission medications   Medication Sig Start Date End Date Taking? Authorizing Provider  aspirin 81 MG chewable tablet Chew 81 mg by mouth daily.    Yes Historical Provider, MD  clopidogrel (PLAVIX) 75 MG tablet Take 1 tablet (75 mg total) by mouth daily. 12/27/13  Yes  Chuck Hinthristopher S Dickson, MD  nicotine (NICODERM CQ) 7 mg/24hr patch Place 7 mg onto the skin daily.   Yes Historical Provider, MD   BP 131/91  Pulse 70  Temp(Src) 97.8 F (36.6 C) (Oral)  Resp 20  SpO2 98% Physical Exam  Constitutional: He is oriented to person, place, and time. He appears well-developed and well-nourished. No distress.  HENT:  Head: Normocephalic and atraumatic.  Right Ear: Hearing normal.  Left Ear: Hearing normal.  Nose: Nose normal.  Mouth/Throat: Oropharynx is clear and moist and mucous membranes are normal.  Eyes: Conjunctivae and EOM are normal. Pupils are equal, round, and reactive to light.  Neck: Normal range of motion. Neck supple.  Cardiovascular: Regular rhythm, S1 normal and S2 normal.  Exam reveals no gallop and no friction rub.   No murmur heard. Pulses:      Dorsalis pedis pulses are 2+ on the right side, and 2+ on the left side.  Pulmonary/Chest: Effort normal and breath sounds normal. No respiratory distress. He exhibits no tenderness.  Abdominal: Soft. Normal appearance and bowel sounds are normal. There is no hepatosplenomegaly. There is no tenderness. There is no rebound, no guarding, no tenderness at McBurney's point and negative Murphy's sign. No hernia.  Musculoskeletal: Normal range of motion.  Neurological: He is alert and oriented to person, place, and time. He has normal strength. No cranial  nerve deficit or sensory deficit. Coordination normal. GCS eye subscore is 4. GCS verbal subscore is 5. GCS motor subscore is 6.  Skin: Skin is warm, dry and intact. No rash noted. No cyanosis.     Psychiatric: He has a normal mood and affect. His speech is normal and behavior is normal. Thought content normal.    ED Course  Procedures (including critical care time) Labs Review Labs Reviewed  CBC WITH DIFFERENTIAL  BASIC METABOLIC PANEL  CK  I-STAT CG4 LACTIC ACID, ED    Imaging Review No results found.   EKG Interpretation None       MDM   Final diagnoses:  None   postoperative pain  Patient presents to the ER with complaints of pain in the right groin as well as the entire right leg. Patient had angioplasty and stenting of right common iliac artery one week ago. Patient has a very small amount of bruising in the right groin at the puncture site. This is consistent with normal postoperative changes, no evidence of hematoma. No bruit. Patient has normal distal sensation and motor function. He has palpable dorsalis pedal pulses. repeat occlusion. Patient is complaining of intermittent spasms of the muscles in the leg, especially at night.  Patient's laboratories are fairly unremarkable. No evidence of ischemia. No rhabdomyolysis. No lactic acidosis. Patient's blood counts were normal. Renal function and electrolytes are unremarkable. Examination is consistent with postoperative changes, and no concern for any reocclusion of his iliac artery. Patient reassured, analgesia and rest. Followup with her vascular surgeon.  Gilda Creasehristopher J. Pollina, MD 01/03/14 2011

## 2014-01-03 NOTE — ED Notes (Signed)
Initial contact-pt A&Ox4. Had bilateral LE stents placed for  arterial blockage. Has had two stent placements since then. Latest stent was placed ten days ago and has had pain and "spasms" since then. Per patient "the doctor said I had a 99% arterial blockage and wanted to take my leg off." Described as "burning sensation and pressure" on right leg and right foot. Still has full sensation in right leg and right foot. Denies chest pain. C/o abdominal pain this morning. Constant pain in bilateral hips and right rib cage. Right stent placement site is maroon and yellow colored-no tenderness noted. Pt says "I walk better now but my leg and foot hurts, it feels like my foot goes to sleep." In NAD. Awaiting MD.

## 2014-01-03 NOTE — ED Notes (Signed)
Pt states that he can't wait any longer. Pt offered something for pain according to pain protocol, but pt states "that will all make me sick. I will go see my normal doctor." Pt made aware that he can return at any time.

## 2014-01-03 NOTE — Discharge Instructions (Signed)
Pain Relief Preoperatively and Postoperatively °Being a good patient does not mean being a silent one. If you have questions, problems, or concerns about the pain you may feel after surgery, let your caregiver know. Patients have the right to assessment and management of pain. The treatment of pain after surgery is important to speed up recovery and return to normal activities. Severe pain after surgery, and the fear or anxiety associated with that pain, may cause extreme discomfort that: °· Prevents sleep. °· Decreases the ability to breathe deeply and cough. This can cause pneumonia or other upper airway infections. °· Causes your heart to beat faster and your blood pressure to be higher. °· Increases the risk for constipation and bloating. °· Decreases the ability of wounds to heal. °· May result in depression, increased anxiety, and feelings of helplessness. °Relief of pain before surgery is also important because it will lessen the pain after surgery. Patients who receive both pain relief before and after surgery experience greater pain relief than those who only receive pain relief after surgery. Let your caregiver know if you are having uncontrolled pain. This is very important. Pain after surgery is more difficult to manage if it is permitted to become severe, so prompt and adequate treatment of acute pain is necessary. °PAIN CONTROL METHODS °Your caregivers follow policies and procedures about the management of patient pain. These guidelines should be explained to you before surgery. Plans for pain control after surgery must be mutually decided upon and instituted with your full understanding and agreement. Do not be afraid to ask questions regarding the care you are receiving. There are many different ways your caregivers will attempt to control your pain, including the following methods. °As needed pain control °· You may be given pain medicine either through your intravenous (IV) tube, or as a pill or  liquid you can swallow. You will need to let your caregiver know when you are having pain. Then, your caregiver will give you the pain medicine ordered for you. °· Your pain medicine may make you constipated. If constipation occurs, drink more liquids if you can. Your caregiver may have you take a mild laxative. °IV patient-controlled analgesia pump (PCA pump) °· You can get your pain medicine through the IV tube which goes into your vein. You are able to control the amount of pain medicine that you get. The pain medicine flows in through an IV tube and is controlled by a pump. This pump gives you a set amount of pain medicine when you push the button hooked up to it. Nobody should push this button but you or someone specifically assigned by you to do so. It is set up to keep you from accidentally giving yourself too much pain medicine. You will be able to start using your pain pump in the recovery room after your surgery. This method can be helpful for most types of surgery. °· If you are still having too much pain, tell your caregiver. Also, tell your caregiver if you are feeling too sleepy or nauseous. °Continuous epidural pain control °· A thin, soft tube (catheter) is put into your back. Pain medicine flows through the catheter to lessen pain in the part of your body where the surgery is done. Continuous epidural pain control may work best for you if you are having surgery on your chest, abdomen, hip area, or legs. The epidural catheter is usually put into your back just before surgery. The catheter is left in until you can eat and take medicine by mouth. In most cases,   this may take 2 to 3 days. °· Giving pain medicine through the epidural catheter may help you heal faster because: °¨ Your bowel gets back to normal faster. °¨ You can get back to eating sooner. °¨ You can be up and walking sooner. °Medicine that numbs the area (local anesthetic) °· You may receive an injection of pain medicine near where the  pain is (local infiltration). °· You may receive an injection of pain medicine near the nerve that controls the sensation to a specific part of the body (peripheral nerve block). °· Medicine may be put in the spine to block pain (spinal block). °Opioids °· Moderate to moderately severe acute pain after surgery may respond to opioids. Opioids are narcotic pain medicine. Opioids are often combined with non-narcotic medicines to improve pain relief, diminish the risk of side effects, and reduce the chance of addiction. °· If you follow your caregiver's directions about taking opioids and you do not have a history of substance abuse, your risk of becoming addicted is exceptionally small. Opioids are given for short periods of time in careful doses to prevent addiction. °Other methods of pain control include: °· Steroids. °· Physical therapy. °· Heat and cold therapy. °· Compression, such as wrapping an elastic bandage around the area of pain. °· Massage. °These various ways of controlling pain may be used together. Combining different methods of pain control is called multimodal analgesia. Using this approach has many benefits, including being able to eat, move around, and leave the hospital sooner. °Document Released: 09/07/2002 Document Revised: 09/09/2011 Document Reviewed: 09/11/2010 °ExitCare® Patient Information ©2015 [employer]. This information is not intended to replace advice given to you by your health care provider. Make sure you discuss any questions you have with your health care provider. ° °Peripheral Vascular Disease °Peripheral Vascular Disease (PVD), also called Peripheral Arterial Disease (PAD), is a circulation problem caused by cholesterol (atherosclerotic plaque) deposits in the arteries. PVD commonly occurs in the lower extremities (legs) but it can occur in other areas of the body, such as your arms. The cholesterol buildup in the arteries reduces blood flow which can cause pain and other  serious problems. The presence of PVD can place a person at risk for Coronary Artery Disease (CAD).  °CAUSES  °Causes of PVD can be many. It is usually associated with more than one risk factor such as:  °High Cholesterol. °Smoking. °Diabetes. °Lack of exercise or inactivity. °High blood pressure (hypertension). °Obesity. °Family history. °SYMPTOMS  °When the lower extremities are affected, patients with PVD may experience: °Leg pain with exertion or physical activity. This is called INTERMITTENT CLAUDICATION. This may present as cramping or numbness with physical activity. The location of the pain is associated with the level of blockage. For example, blockage at the abdominal level (distal abdominal aorta) may result in buttock or hip pain. Lower leg arterial blockage may result in calf pain. °As PVD becomes more severe, pain can develop with less physical activity. °In people with severe PVD, leg pain may occur at rest. °Other PVD signs and symptoms: °Leg numbness or weakness. °Coldness in the affected leg or foot, especially when compared to the other leg. °A change in leg color. °Patients with significant PVD are more prone to ulcers or sores on toes, feet or legs. These may take longer to heal or may reoccur. The ulcers or sores can become infected. °If signs and symptoms of PVD are ignored, gangrene may occur. This can result in the loss of toes or loss   of an entire limb. °Not all leg pain is related to PVD. Other medical conditions can cause leg pain such as: °Blood clots (embolism) or Deep Vein Thrombosis. °Inflammation of the blood vessels (vasculitis). °Spinal stenosis. °DIAGNOSIS  °Diagnosis of PVD can involve several different types of tests. These can include: °Pulse Volume Recording Method (PVR). This test is simple, painless and does not involve the use of X-rays. PVR involves measuring and comparing the blood pressure in the arms and legs. An ABI (Ankle-Brachial Index) is calculated. The normal  ratio of blood pressures is 1. As this number becomes smaller, it indicates more severe disease. °< 0.95 - indicates significant narrowing in one or more leg vessels. °<0.8 - there will usually be pain in the foot, leg or buttock with exercise. °<0.4 - will usually have pain in the legs at rest. °<0.25 - usually indicates limb threatening PVD. °Doppler detection of pulses in the legs. This test is painless and checks to see if you have a pulses in your legs/feet. °A dye or contrast material (a substance that highlights the blood vessels so they show up on x-ray) may be given to help your caregiver better see the arteries for the following tests. The dye is eliminated from your body by the kidney's. Your caregiver may order blood work to check your kidney function and other laboratory values before the following tests are performed: °Magnetic Resonance Angiography (MRA). An MRA is a picture study of the blood vessels and arteries. The MRA machine uses a large magnet to produce images of the blood vessels. °Computed Tomography Angiography (CTA). A CTA is a specialized x-ray that looks at how the blood flows in your blood vessels. An IV may be inserted into your arm so contrast dye can be injected. °Angiogram. Is a procedure that uses x-rays to look at your blood vessels. This procedure is minimally invasive, meaning a small incision (cut) is made in your groin. A small tube (catheter) is then inserted into the artery of your groin. The catheter is guided to the blood vessel or artery your caregiver wants to examine. Contrast dye is injected into the catheter. X-rays are then taken of the blood vessel or artery. After the images are obtained, the catheter is taken out. °TREATMENT  °Treatment of PVD involves many interventions which may include: °Lifestyle changes: °Quitting smoking. °Exercise. °Following a low fat, low cholesterol diet. °Control of diabetes. °Foot care is very important to the PVD patient. Good foot  care can help prevent infection. °Medication: °Cholesterol-lowering medicine. °Blood pressure medicine. °Anti-platelet drugs. °Certain medicines may reduce symptoms of Intermittent Claudication. °Interventional/Surgical options: °Angioplasty. An Angioplasty is a procedure that inflates a balloon in the blocked artery. This opens the blocked artery to improve blood flow. °Stent Implant. A wire mesh tube (stent) is placed in the artery. The stent expands and stays in place, allowing the artery to remain open. °Peripheral Bypass Surgery. This is a surgical procedure that reroutes the blood around a blocked artery to help improve blood flow. This type of procedure may be performed if Angioplasty or stent implants are not an option. °SEEK IMMEDIATE MEDICAL CARE IF:  °You develop pain or numbness in your arms or legs. °Your arm or leg turns cold, becomes blue in color. °You develop redness, warmth, swelling and pain in your arms or legs. °MAKE SURE YOU:  °Understand these instructions. °Will watch your condition. °Will get help right away if you are not doing well or get worse. °Document Released: 07/25/2004 Document   Revised: 09/09/2011 Document Reviewed: 06/21/2008 °ExitCare® Patient Information ©2015 [employer]. This information is not intended to replace advice given to you by your health care provider. Make sure you discuss any questions you have with your health care provider. ° ° °

## 2014-01-03 NOTE — ED Notes (Signed)
Lactic Acid given to Dr. Blinda LeatherwoodPollina.

## 2014-01-03 NOTE — ED Notes (Addendum)
Pt had surgery with iliac and coronary angioplasty stent placed on the 29th at [facility]. Pt now c/o pain from right upper side to foot. Pt c/o sharp burning, shooting pain.

## 2014-01-17 ENCOUNTER — Encounter: Payer: Self-pay | Admitting: Vascular Surgery

## 2014-02-08 ENCOUNTER — Encounter: Payer: Self-pay | Admitting: Vascular Surgery

## 2014-02-09 ENCOUNTER — Ambulatory Visit (HOSPITAL_COMMUNITY)
Admission: RE | Admit: 2014-02-09 | Discharge: 2014-02-09 | Disposition: A | Discharge status: Home / Self Care | Payer: Self-pay | Source: Ambulatory Visit | Attending: Vascular Surgery | Admitting: Vascular Surgery

## 2014-02-09 ENCOUNTER — Encounter: Payer: Self-pay | Admitting: Vascular Surgery

## 2014-02-09 ENCOUNTER — Ambulatory Visit (INDEPENDENT_AMBULATORY_CARE_PROVIDER_SITE_OTHER): Payer: Self-pay | Admitting: Vascular Surgery

## 2014-02-09 VITALS — BP 140/93 | HR 55 | Ht 71.0 in | Wt 151.0 lb

## 2014-02-09 DIAGNOSIS — I739 Peripheral vascular disease, unspecified: Secondary | ICD-10-CM | POA: Insufficient documentation

## 2014-02-09 DIAGNOSIS — Z7901 Long term (current) use of anticoagulants: Secondary | ICD-10-CM | POA: Insufficient documentation

## 2014-02-09 DIAGNOSIS — I70219 Atherosclerosis of native arteries of extremities with intermittent claudication, unspecified extremity: Secondary | ICD-10-CM | POA: Insufficient documentation

## 2014-02-09 DIAGNOSIS — Z48812 Encounter for surgical aftercare following surgery on the circulatory system: Secondary | ICD-10-CM | POA: Insufficient documentation

## 2014-02-09 DIAGNOSIS — Z87891 Personal history of nicotine dependence: Secondary | ICD-10-CM | POA: Insufficient documentation

## 2014-02-09 MED ORDER — CLOPIDOGREL BISULFATE 75 MG PO TABS: 75.0000 mg | ORAL_TABLET | Freq: Every day | ORAL | Status: AC

## 2014-02-09 NOTE — Assessment & Plan Note (Signed)
The patient is doing well status post angioplasty and stenting of an occluded right common iliac artery. He quit smoking. He is on Plavix. I've encouraged him to stay as active as possible. I'll see him back in 6 months with the duplex of the stent and ABIs at that time. He knows to call sooner if he has problems.

## 2014-02-09 NOTE — Addendum Note (Signed)
Addended by: Sharee PimpleMCCHESNEY, MARILYN K on: 02/09/2014 05:32 PM   Modules accepted: Orders

## 2014-02-09 NOTE — Progress Notes (Signed)
   Patient name: Jacob Duncan MRN: [id number] DOB: 21-Apr-1966 Sex: male  REASON FOR VISIT: Follow up after angioplasty and stenting of an occluded right common iliac artery.  HPI: Jacob Duncan is a 48 y.o. male who had presented with progressive ischemia of his right lower extremity. He had no femoral pulse on the right. On 12/27/2013, he underwent angioplasty and stenting of an occluded right common iliac artery with a 7 mm x 6 cm self-expanding stent. This was ballooned up to 6 mm. He comes in for a follow up visit.   His claudication symptoms in the right lower extremity have resolved. In addition, he has quit smoking. He denies rest pain.  REVIEW OF SYSTEMS: Arly.Keller[X ] denotes positive finding; [  ] denotes negative finding  CARDIOVASCULAR:  [ ]  chest pain   [ ]  dyspnea on exertion    CONSTITUTIONAL:  [ ]  fever   [ ]  chills  PHYSICAL EXAM: Filed Vitals:   02/09/14 1100  BP: 140/93  Pulse: 55  Height: 5\' 11"  (1.803 m)  Weight: 151 lb (68.493 kg)  SpO2: 100%   Body mass index is 21.07 kg/(m^2). GENERAL: The patient is a well-nourished male, in no acute distress. The vital signs are documented above. CARDIOVASCULAR: There is a regular rate and rhythm. PULMONARY: There is good air exchange bilaterally without wheezing or rales. He has palpable femoral pulses and palpable posterior tibial pulses bilaterally.  I have independently interpreted his arterial Doppler study today which shows ABIs of 100% bilaterally. He has triphasic Doppler signals in the right dorsalis pedis and posterior tibial positions. In addition, he has triphasic Doppler signals in the left dorsalis pedis and posterior tibial positions.  MEDICAL ISSUES:  PVD (peripheral vascular disease) The patient is doing well status post angioplasty and stenting of an occluded right common iliac artery. He quit smoking. He is on Plavix. I've encouraged him to stay as active as possible. I'll see him back in 6 months  with the duplex of the stent and ABIs at that time. He knows to call sooner if he has problems.   Amiya Escamilla S [facility] [place] Beeper: [phone]

## 2014-03-07 ENCOUNTER — Emergency Department (HOSPITAL_COMMUNITY)
Admission: EM | Admit: 2014-03-07 | Discharge: 2014-03-07 | Disposition: A | Discharge status: Home / Self Care | Payer: PRIVATE HEALTH INSURANCE | Attending: Emergency Medicine | Admitting: Emergency Medicine

## 2014-03-07 DIAGNOSIS — Z87891 Personal history of nicotine dependence: Secondary | ICD-10-CM | POA: Insufficient documentation

## 2014-03-07 DIAGNOSIS — Z7982 Long term (current) use of aspirin: Secondary | ICD-10-CM | POA: Insufficient documentation

## 2014-03-07 DIAGNOSIS — H538 Other visual disturbances: Secondary | ICD-10-CM | POA: Insufficient documentation

## 2014-03-07 DIAGNOSIS — H5711 Ocular pain, right eye: Secondary | ICD-10-CM

## 2014-03-07 DIAGNOSIS — I2510 Atherosclerotic heart disease of native coronary artery without angina pectoris: Secondary | ICD-10-CM | POA: Insufficient documentation

## 2014-03-07 DIAGNOSIS — Z79899 Other long term (current) drug therapy: Secondary | ICD-10-CM | POA: Insufficient documentation

## 2014-03-07 DIAGNOSIS — Z9861 Coronary angioplasty status: Secondary | ICD-10-CM | POA: Insufficient documentation

## 2014-03-07 DIAGNOSIS — H5710 Ocular pain, unspecified eye: Secondary | ICD-10-CM | POA: Insufficient documentation

## 2014-03-07 MED ORDER — TETRACAINE HCL 0.5 % OP SOLN
2.0000 [drp] | Freq: Once | OPHTHALMIC | Status: AC
  Administered 2014-03-07: 2 [drp] via OPHTHALMIC
  Filled 2014-03-07: qty 2

## 2014-03-07 NOTE — Discharge Instructions (Signed)
Visual Disturbances °You have had a disturbance in your vision. This may be caused by various conditions, such as: °· Migraines. Migraine headaches are often preceded by a disturbance in vision. Blind spots or light flashes are followed by a headache. This type of visual disturbance is temporary. It does not damage the eye. °· Glaucoma. This is caused by increased pressure in the eye. Symptoms include haziness, blurred vision, or seeing rainbow colored circles when looking at bright lights. Partial or complete visual loss can occur. You may or may not experience eye pain. Visual loss may be gradual or sudden and is irreversible. Glaucoma is the leading cause of blindness. °· Retina problems. Vision will be reduced if the retina becomes detached or if there is a circulation problem as with diabetes, high blood pressure, or a mini-stroke. Symptoms include seeing "floaters," flashes of light, or shadows, as if a curtain has fallen over your eye. °· Optic nerve problems. The main nerve in your eye can be damaged by redness, soreness, and swelling (inflammation), poor circulation, drugs, and toxins. °It is very important to have a complete exam done by a specialist to determine the exact cause of your eye problem. The specialist may recommend medicines or surgery, depending on the cause of the problem. This can help prevent further loss of vision or reduce the risk of having a stroke. Contact the caregiver to whom you have been referred and arrange for follow-up care right away. °SEEK IMMEDIATE MEDICAL CARE IF:  °· Your vision gets worse. °· You develop severe headaches. °· You have any weakness or numbness in the face, arms, or legs. °· You have any trouble speaking or walking. °Document Released: 07/25/2004 Document Revised: 09/09/2011 Document Reviewed: 11/15/2009 °ExitCare® Patient Information ©2015 [employer]. This information is not intended to replace advice given to you by your health care provider. Make sure  you discuss any questions you have with your health care provider. ° °

## 2014-03-07 NOTE — ED Notes (Signed)
Pt reports hx of cardiac issues and stents placed, reports right eye blurriness, started 4 months ago, has not seen doctor from problem, pt reports he thought eye problem would get better. Reports now vision in right eye is just a haze. Denies pain.

## 2014-03-07 NOTE — ED Notes (Signed)
Bed: WA02 Expected date:  Expected time:  Means of arrival:  Comments: 

## 2014-03-13 NOTE — ED Provider Notes (Signed)
CSN: [id number]     Arrival date & time 03/07/14  0725 History   First MD Initiated Contact with Patient 03/07/14 [phone]     Chief Complaint  Patient presents with  . Eye Problem     (Consider location/radiation/quality/duration/timing/severity/associated sxs/prior Treatment) HPI  48 year old male with progressively worsening right eye blurring. Symptom onset 3-4 months ago and progressively worsening. Occasionally some mild pain. Symptoms are constant. Has not noticed any appreciable exacerbating relieving factors. No change when going from light/dark environments. No pain with eye movement. Denies any trauma. No fevers or chills. No neck pain. No acute complaints otherwise. Has not sought prior evaluation.  Past Medical History  Diagnosis Date  . Peripheral vascular disease   . Complication of anesthesia   . PONV (postoperative nausea and vomiting)   . Family history of anesthesia complication     " MOTHER HAS NAUSEA ALSO "  . Coronary artery disease   . FAOZHYQM(578.4)    Past Surgical History  Procedure Laterality Date  . Coronary angioplasty with stent placement    . Iliac artery stent Right 12/27/2013    dr Edilia Bo   Family History  Problem Relation Age of Onset  . Deep vein thrombosis Mother    History  Substance Use Topics  . Smoking status: Former Smoker -- 1.50 packs/day for 20 years    Quit date: 12/01/2013  . Smokeless tobacco: Never Used  . Alcohol Use: No    Review of Systems  All systems reviewed and negative, other than as noted in HPI.   Allergies  Codeine  Home Medications   Prior to Admission medications   Medication Sig Start Date End Date Taking? Authorizing Provider  aspirin 81 MG chewable tablet Chew 81 mg by mouth daily.    Yes Historical Provider, MD  clopidogrel (PLAVIX) 75 MG tablet Take 1 tablet (75 mg total) by mouth daily. 02/09/14  Yes Chuck Hint, MD  HYDROcodone-acetaminophen (NORCO/VICODIN) 5-325 MG per tablet Take 2  tablets by mouth every 4 (four) hours as needed for moderate pain. 01/03/14  Yes Gilda Crease, MD  nicotine (NICODERM CQ) 7 mg/24hr patch Place 7 mg onto the skin daily.   Yes Historical Provider, MD   BP 129/86  Pulse 57  Temp(Src) 98.2 F (36.8 C) (Oral)  Resp 16  SpO2 99% Physical Exam  Nursing note and vitals reviewed. Constitutional: He appears well-developed and well-nourished. No distress.  HENT:  Head: Normocephalic and atraumatic.  No temporal tenderness. Palpable temporal arteries bilaterally.  Eyes: Conjunctivae are normal. Pupils are equal, round, and reactive to light. Right eye exhibits no discharge. Left eye exhibits no discharge.  periorbital tissues/lids/lashes are unremarkable. Pupils are round react to light and accommodation. Anterior chambers are clear. Optic disc sharp. Extraocular pressures, OD: 16,16; OS: 14,18.   Neck: Neck supple.  Cardiovascular: Normal rate, regular rhythm and normal heart sounds.  Exam reveals no gallop and no friction rub.   No murmur heard. Pulmonary/Chest: Effort normal and breath sounds normal. No respiratory distress.  Abdominal: Soft. He exhibits no distension. There is no tenderness.  Musculoskeletal: He exhibits no edema and no tenderness.  Neurological: He is alert.  Skin: Skin is warm and dry.  Psychiatric: He has a normal mood and affect. His behavior is normal. Thought content normal.    ED Course  Procedures (including critical care time) Labs Review Labs Reviewed - No data to display  Imaging Review No results found.   EKG Interpretation None  MDM   Final diagnoses:  Eye pain, right  Blurred vision    48 year old male with some blurred vision in his right eye. I did unclear etiology of this. Has been progressively worsening over the past several months. His intraocular pressures are normal. Bedside ultrasound does not show evidence of vitreous hemorrhage or retinal detachment. Normal-appearing  optic discs. Highly unlikely temporal arteritis given his age. No temporal tenderness. Temporal pulses feel symmetric. Needs prompt optho FU.     Raeford Razor, MD 03/13/14 [phone]

## 2014-04-07 DIAGNOSIS — Z0279 Encounter for issue of other medical certificate: Secondary | ICD-10-CM

## 2014-06-09 ENCOUNTER — Encounter (HOSPITAL_COMMUNITY): Payer: Self-pay | Admitting: Vascular Surgery

## 2014-08-10 ENCOUNTER — Other Ambulatory Visit: Payer: Self-pay | Admitting: *Deleted

## 2014-08-10 DIAGNOSIS — Z9862 Peripheral vascular angioplasty status: Secondary | ICD-10-CM

## 2014-08-10 DIAGNOSIS — I739 Peripheral vascular disease, unspecified: Secondary | ICD-10-CM

## 2014-08-16 ENCOUNTER — Encounter: Payer: Self-pay | Admitting: Vascular Surgery

## 2014-08-17 ENCOUNTER — Inpatient Hospital Stay (HOSPITAL_COMMUNITY)
Admission: RE | Admit: 2014-08-17 | Discharge: 2014-08-17 | Disposition: A | Discharge status: Home / Self Care | Payer: Self-pay | Source: Ambulatory Visit | Attending: Vascular Surgery | Admitting: Vascular Surgery

## 2014-08-17 ENCOUNTER — Ambulatory Visit: Payer: Self-pay | Admitting: Vascular Surgery

## 2014-08-17 ENCOUNTER — Encounter (HOSPITAL_COMMUNITY): Payer: Self-pay

## 2014-08-17 DIAGNOSIS — Z48812 Encounter for surgical aftercare following surgery on the circulatory system: Secondary | ICD-10-CM

## 2014-08-17 DIAGNOSIS — I70219 Atherosclerosis of native arteries of extremities with intermittent claudication, unspecified extremity: Secondary | ICD-10-CM

## 2014-09-21 ENCOUNTER — Encounter (HOSPITAL_COMMUNITY): Payer: PRIVATE HEALTH INSURANCE

## 2014-09-21 ENCOUNTER — Other Ambulatory Visit (HOSPITAL_COMMUNITY): Payer: PRIVATE HEALTH INSURANCE

## 2014-09-21 ENCOUNTER — Ambulatory Visit: Payer: PRIVATE HEALTH INSURANCE | Admitting: Vascular Surgery

## 2014-09-29 ENCOUNTER — Other Ambulatory Visit: Payer: Self-pay | Admitting: Vascular Surgery

## 2014-09-29 DIAGNOSIS — I70219 Atherosclerosis of native arteries of extremities with intermittent claudication, unspecified extremity: Secondary | ICD-10-CM

## 2014-09-29 DIAGNOSIS — Z9862 Peripheral vascular angioplasty status: Secondary | ICD-10-CM

## 2014-11-01 ENCOUNTER — Encounter: Payer: Self-pay | Admitting: Vascular Surgery

## 2014-11-02 ENCOUNTER — Other Ambulatory Visit: Payer: Self-pay | Admitting: Vascular Surgery

## 2014-11-02 ENCOUNTER — Ambulatory Visit (INDEPENDENT_AMBULATORY_CARE_PROVIDER_SITE_OTHER)
Admission: RE | Admit: 2014-11-02 | Discharge: 2014-11-02 | Disposition: A | Discharge status: Home / Self Care | Payer: PRIVATE HEALTH INSURANCE | Source: Ambulatory Visit | Attending: Vascular Surgery | Admitting: Vascular Surgery

## 2014-11-02 ENCOUNTER — Ambulatory Visit (HOSPITAL_COMMUNITY)
Admission: RE | Admit: 2014-11-02 | Discharge: 2014-11-02 | Disposition: A | Discharge status: Home / Self Care | Payer: PRIVATE HEALTH INSURANCE | Source: Ambulatory Visit | Attending: Vascular Surgery | Admitting: Vascular Surgery

## 2014-11-02 ENCOUNTER — Ambulatory Visit (INDEPENDENT_AMBULATORY_CARE_PROVIDER_SITE_OTHER): Payer: PRIVATE HEALTH INSURANCE | Admitting: Vascular Surgery

## 2014-11-02 ENCOUNTER — Encounter: Payer: Self-pay | Admitting: Vascular Surgery

## 2014-11-02 VITALS — BP 135/85 | HR 62 | Ht 71.0 in | Wt 143.0 lb

## 2014-11-02 DIAGNOSIS — Z9889 Other specified postprocedural states: Secondary | ICD-10-CM | POA: Diagnosis not present

## 2014-11-02 DIAGNOSIS — I70219 Atherosclerosis of native arteries of extremities with intermittent claudication, unspecified extremity: Secondary | ICD-10-CM

## 2014-11-02 DIAGNOSIS — Z9862 Peripheral vascular angioplasty status: Secondary | ICD-10-CM

## 2014-11-02 DIAGNOSIS — I739 Peripheral vascular disease, unspecified: Secondary | ICD-10-CM

## 2014-11-02 DIAGNOSIS — I70209 Unspecified atherosclerosis of native arteries of extremities, unspecified extremity: Secondary | ICD-10-CM

## 2014-11-02 NOTE — Progress Notes (Signed)
[facility]  Patient name: Jacob Duncan MRN: [id number] DOB: 01-16-1966 Sex: male  REASON FOR VISIT: Follow up of peripheral vascular disease.  HPI: Jacob Duncan is a 49 y.o. male who had presented with progressive ischemia of his right lower extremity. On 12/27/2013 he underwent angioplasty and stenting of an occluded right common iliac artery with a 7 mm x 6 cm self-expanding stent. On his most recent follow up visit in August, he had an ABI of 100% bilaterally. He had triphasic Doppler signals in the right dorsalis pedis and posterior tibial positions. He had quit smoking at that time. He was on Plavix. He comes in for a 6 month follow up visit.  Unfortunately, since his last visit he has started smoking again and smokes a half a pack per day of cigarettes. In addition he could not afford his Plavix was not taking Plavix. He is on aspirin.  Past Medical History  Diagnosis Date  . Peripheral vascular disease   . Complication of anesthesia   . PONV (postoperative nausea and vomiting)   . Family history of anesthesia complication     " MOTHER HAS NAUSEA ALSO "  . Coronary artery disease   . Headache(784.0)    Family History  Problem Relation Age of Onset  . Deep vein thrombosis Mother   . Hyperlipidemia Mother   . Hypertension Mother   . Peripheral vascular disease Mother   . Hyperlipidemia Father   . Hypertension Father    SOCIAL HISTORY: History  Substance Use Topics  . Smoking status: Current Every Day Smoker -- 1.50 packs/day for 20 years    Last Attempt to Quit: 12/01/2013  . Smokeless tobacco: Never Used  . Alcohol Use: No   Allergies  Allergen Reactions  . Codeine     " room starts spinning"   Current Outpatient Prescriptions  Medication Sig Dispense Refill  . aspirin 81 MG chewable tablet Chew 81 mg by mouth daily.     . clopidogrel (PLAVIX) 75 MG tablet Take 1 tablet (75 mg total) by mouth daily. (Patient not  taking: Reported on 11/02/2014) 30 tablet 6  . HYDROcodone-acetaminophen (NORCO/VICODIN) 5-325 MG per tablet Take 2 tablets by mouth every 4 (four) hours as needed for moderate pain. (Patient not taking: Reported on 11/02/2014) 10 tablet 0  . nicotine (NICODERM CQ) 7 mg/24hr patch Place 7 mg onto the skin daily.     No current facility-administered medications for this visit.   REVIEW OF SYSTEMS: Arly.Keller[X ] denotes positive finding; [  ] denotes negative finding  CARDIOVASCULAR:  [ ]  chest pain   [ ]  chest pressure   [ ]  palpitations   [ ]  orthopnea   [ ]  dyspnea on exertion   [ ]  claudication   [ ]  rest pain   [ ]  DVT   [ ]  phlebitis PULMONARY:   [ ]  productive cough   [ ]  asthma   [ ]  wheezing NEUROLOGIC:   [ ]  weakness  [ ]  paresthesias  [ ]  aphasia  [ ]  amaurosis  [ ]  dizziness HEMATOLOGIC:   [ ]  bleeding problems   [ ]  clotting disorders MUSCULOSKELETAL:  [ ]  joint pain   [ ]  joint swelling [ ]  leg swelling GASTROINTESTINAL: [ ]   blood in stool  [ ]   hematemesis GENITOURINARY:  [ ]   dysuria  [ ]   hematuria PSYCHIATRIC:  [ ]  history of major depression INTEGUMENTARY:  [ ]  rashes  [ ]   ulcers CONSTITUTIONAL:  [ ]  fever   [ ]  chills  PHYSICAL EXAM: Filed Vitals:   11/02/14 1156  BP: 135/85  Pulse: 62  Height: 5\' 11"  (1.803 m)  Weight: 143 lb (64.864 kg)  SpO2: 99%   GENERAL: The patient is a well-nourished male, in no acute distress. The vital signs are documented above. CARDIOVASCULAR: There is a regular rate and rhythm. I do not detect carotid bruits. He has palpable femoral pulses and palpable posterior tibial pulses bilaterally. Both feet are warm and well perfused. PULMONARY: There is good air exchange bilaterally without wheezing or rales. ABDOMEN: Soft and non-tender with normal pitched bowel sounds.  MUSCULOSKELETAL: There are no major deformities or cyanosis. NEUROLOGIC: No focal weakness or paresthesias are detected. SKIN: There are no ulcers or rashes noted. PSYCHIATRIC: The  patient has a normal affect.  DATA:  I have independently interpreted his arterial Doppler study today which shows biphasic Doppler signals in the right dorsalis pedis and posterior tibial positions with an ABI of 100%. On the left side he has a triphasic posterior tibial signal and a biphasic dorsalis pedis signal with an ABI of 100%.  I have independent and TURP or to the duplex scan of his stent which shows a 356 cm/s velocity in the proximal stent consistent with a moderate stenosis.  MEDICAL ISSUES: STATUS POST  ANGIOPLASTY AND STENTING OF OCCLUDED RIGHT COMMON ILIAC ARTERY: He has ABIs of 100% bilaterally. He does have some moderate stenosis in the proximal stent on the right and will need to follow this closely. I ordered a follow up duplex scan in 6 months and I'll see him back at that time. I have encouraged him to stay as active as possible. We have again discussed the importance of tobacco cessation. I've encouraged him to take 325 mg of aspirin a day. He cannot afford the Plavix which I understand.   Return in about 6 months (around 05/05/2015).   Waverly Ferrariickson, Romie Tay [facility] [place] [facility]: [phone]

## 2014-11-03 NOTE — Addendum Note (Signed)
Addended by: Sharee PimpleMCCHESNEY, MARILYN K on: 11/03/2014 11:58 AM   Modules accepted: Orders

## 2014-12-17 ENCOUNTER — Encounter (HOSPITAL_COMMUNITY): Payer: Self-pay | Admitting: Emergency Medicine

## 2014-12-17 ENCOUNTER — Emergency Department (HOSPITAL_COMMUNITY)
Admission: EM | Admit: 2014-12-17 | Discharge: 2014-12-17 | Disposition: A | Discharge status: Home / Self Care | Payer: PRIVATE HEALTH INSURANCE | Attending: Emergency Medicine | Admitting: Emergency Medicine

## 2014-12-17 DIAGNOSIS — Z79899 Other long term (current) drug therapy: Secondary | ICD-10-CM | POA: Insufficient documentation

## 2014-12-17 DIAGNOSIS — I2510 Atherosclerotic heart disease of native coronary artery without angina pectoris: Secondary | ICD-10-CM | POA: Insufficient documentation

## 2014-12-17 DIAGNOSIS — Z720 Tobacco use: Secondary | ICD-10-CM | POA: Insufficient documentation

## 2014-12-17 DIAGNOSIS — M545 Low back pain, unspecified: Secondary | ICD-10-CM

## 2014-12-17 DIAGNOSIS — Z9861 Coronary angioplasty status: Secondary | ICD-10-CM | POA: Insufficient documentation

## 2014-12-17 DIAGNOSIS — Z7982 Long term (current) use of aspirin: Secondary | ICD-10-CM | POA: Insufficient documentation

## 2014-12-17 MED ORDER — CYCLOBENZAPRINE HCL 5 MG PO TABS: 5.0000 mg | ORAL_TABLET | Freq: Three times a day (TID) | ORAL | Status: AC | PRN

## 2014-12-17 MED ORDER — KETOROLAC TROMETHAMINE 30 MG/ML IJ SOLN
30.0000 mg | Freq: Once | INTRAMUSCULAR | Status: DC
  Filled 2014-12-17: qty 1

## 2014-12-17 MED ORDER — IBUPROFEN 200 MG PO TABS
600.0000 mg | ORAL_TABLET | Freq: Once | ORAL | Status: AC
  Administered 2014-12-17: 600 mg via ORAL
  Filled 2014-12-17: qty 3

## 2014-12-17 MED ORDER — CYCLOBENZAPRINE HCL 10 MG PO TABS
ORAL_TABLET | ORAL | Status: AC
  Filled 2014-12-17: qty 1

## 2014-12-17 MED ORDER — IBUPROFEN 600 MG PO TABS: 600.0000 mg | ORAL_TABLET | Freq: Four times a day (QID) | ORAL | Status: AC | PRN

## 2014-12-17 MED ORDER — CYCLOBENZAPRINE HCL 10 MG PO TABS
5.0000 mg | ORAL_TABLET | Freq: Once | ORAL | Status: AC
  Administered 2014-12-17: 5 mg via ORAL
  Filled 2014-12-17: qty 1

## 2014-12-17 NOTE — Discharge Instructions (Signed)
Back Injury Prevention Back injuries can be extremely painful and difficult to heal. After having one back injury, you are much more likely to experience another later on. It is important to learn how to avoid injuring or re-injuring your back. The following tips can help you to prevent a back injury. PHYSICAL FITNESS  Exercise regularly and try to develop good tone in your abdominal muscles. Your abdominal muscles provide a lot of the support needed by your back.  Do aerobic exercises (walking, jogging, biking, swimming) regularly.  Do exercises that increase balance and strength (tai chi, yoga) regularly. This can decrease your risk of falling and injuring your back.  Stretch before and after exercising.  Maintain a healthy weight. The more you weigh, the more stress is placed on your back. For every pound of weight, 10 times that amount of pressure is placed on the back. DIET  Talk to your caregiver about how much calcium and vitamin D you need per day. These nutrients help to prevent weakening of the bones (osteoporosis). Osteoporosis can cause broken (fractured) bones that lead to back pain.  Include good sources of calcium in your diet, such as dairy products, green, leafy vegetables, and products with calcium added (fortified).  Include good sources of vitamin D in your diet, such as milk and foods that are fortified with vitamin D.  Consider taking a nutritional supplement or a multivitamin if needed.  Stop smoking if you smoke. POSTURE  Sit and stand up straight. Avoid leaning forward when you sit or hunching over when you stand.  Choose chairs with good low back (lumbar) support.  If you work at a desk, sit close to your work so you do not need to lean over. Keep your chin tucked in. Keep your neck drawn back and elbows bent at a right angle. Your arms should look like the letter "L."  Sit high and close to the steering wheel when you drive. Add a lumbar support to your car  seat if needed.  Avoid sitting or standing in one position for too long. Take breaks to get up, stretch, and walk around at least once every hour. Take breaks if you are driving for long periods of time.  Sleep on your side with your knees slightly bent, or sleep on your back with a pillow under your knees. Do not sleep on your stomach. LIFTING, TWISTING, AND REACHING  Avoid heavy lifting, especially repetitive lifting. If you must do heavy lifting:  Stretch before lifting.  Work slowly.  Rest between lifts.  Use carts and dollies to move objects when possible.  Make several small trips instead of carrying 1 heavy load.  Ask for help when you need it.  Ask for help when moving big, awkward objects.  Follow these steps when lifting:  Stand with your feet shoulder-width apart.  Get as close to the object as you can. Do not try to pick up heavy objects that are far from your body.  Use handles or lifting straps if they are available.  Bend at your knees. Squat down, but keep your heels off the floor.  Keep your shoulders pulled back, your chin tucked in, and your back straight.  Lift the object slowly, tightening the muscles in your legs, abdomen, and buttocks. Keep the object as close to the center of your body as possible.  When you put a load down, use these same guidelines in reverse.  Do not:  Lift the object above your waist.  Twist at the waist while lifting or carrying a load. Move your feet if you need to turn, not your waist.  Bend over without bending at your knees.  Avoid reaching over your head, across a table, or for an object on a high surface. OTHER TIPS  Avoid wet floors and keep sidewalks clear of ice to prevent falls.  Do not sleep on a mattress that is too soft or too hard.  Keep items that are used frequently within easy reach.  Put heavier objects on shelves at waist level and lighter objects on lower or higher shelves.  Find ways to  decrease your stress, such as exercise, massage, or relaxation techniques. Stress can build up in your muscles. Tense muscles are more vulnerable to injury.  Seek treatment for depression or anxiety if needed. These conditions can increase your risk of developing back pain. SEEK MEDICAL CARE IF:  You injure your back.  You have questions about diet, exercise, or other ways to prevent back injuries. MAKE SURE YOU:  Understand these instructions.  Will watch your condition.  Will get help right away if you are not doing well or get worse. Document Released: 07/25/2004 Document Revised: 09/09/2011 Document Reviewed: 07/29/2011 Exit[employer] Patient Information 2015 [employer]. This information is not intended to replace advice given to you by your health care provider. Make sure you discuss any questions you have with your health care provider.  

## 2014-12-17 NOTE — ED Notes (Signed)
Pt. Came in with complaint of left lower back pain at 8/10 which started yesterday at 10 am, pt. Stated he pulled a muscle on his back while working on the floor , denies fall. Pain worsen as he woke up this morning. Pt. Ambulatory upon arrival to ED.

## 2014-12-17 NOTE — ED Provider Notes (Signed)
CSN: [id number]     Arrival date & time 12/17/14  1478 History   None    Chief Complaint  Patient presents with  . Back Pain     (Consider location/radiation/quality/duration/timing/severity/associated sxs/prior Treatment) Patient is a 49 y.o. male presenting with back pain. The history is provided by the patient.  Back Pain Location:  Lumbar spine Radiates to:  Does not radiate Pain severity:  Moderate Timing:  Constant Relieved by:  None tried Worsened by:  Movement Ineffective treatments:  None tried Associated symptoms: no abdominal pain, no chest pain, no fever, no numbness and no weakness     Mr. Pecina is a 49 yo M PMH PVD that is presenting with left lumbar back pain. The pain started yesterday after he was spraying for weeks. The pain is sharp and constant. It is worse with movement and improved with standing. Rated as 8/10 with no radiculopathy.  He denies any weakness, tingling, chills, fever, night sweats, chest pain, SOB, ab pain, or urinary or bowel incontinence.    Past Medical History  Diagnosis Date  . Peripheral vascular disease   . Complication of anesthesia   . PONV (postoperative nausea and vomiting)   . Family history of anesthesia complication     " MOTHER HAS NAUSEA ALSO "  . Coronary artery disease   . GNFAOZHY(865.7)    Past Surgical History  Procedure Laterality Date  . Coronary angioplasty with stent placement    . Iliac artery stent Right 12/27/2013    dr Edilia Bo  . Abdominal aortagram N/A 12/27/2013    Procedure: ABDOMINAL Ronny Flurry;  Surgeon: Chuck Hint, MD;  Location: [facility] CATH LAB;  Service: Cardiovascular;  Laterality: N/A;  . Cataract extraction, bilateral     Family History  Problem Relation Age of Onset  . Deep vein thrombosis Mother   . Hyperlipidemia Mother   . Hypertension Mother   . Peripheral vascular disease Mother   . Hyperlipidemia Father   . Hypertension Father    History  Substance Use Topics  . Smoking  status: Current Every Day Smoker -- 1.50 packs/day for 20 years    Last Attempt to Quit: 12/01/2013  . Smokeless tobacco: Never Used  . Alcohol Use: No    Review of Systems  Constitutional: Negative for fever and chills.  Respiratory: Negative for cough and shortness of breath.   Cardiovascular: Negative for chest pain.  Gastrointestinal: Negative for nausea, vomiting, abdominal pain, diarrhea and constipation.  Musculoskeletal: Positive for back pain.  Skin: Negative for rash.  Neurological: Negative for weakness and numbness.      Allergies  Codeine and Other  Home Medications   Prior to Admission medications   Medication Sig Start Date End Date Taking? Authorizing Provider  aspirin 81 MG chewable tablet Chew 81 mg by mouth daily.    Yes Historical Provider, MD  clopidogrel (PLAVIX) 75 MG tablet Take 1 tablet (75 mg total) by mouth daily. Patient not taking: Reported on 11/02/2014 02/09/14   Chuck Hint, MD  cyclobenzaprine (FLEXERIL) 5 MG tablet Take 1 tablet (5 mg total) by mouth 3 (three) times daily as needed for muscle spasms. 12/17/14   Myra Rude, MD  HYDROcodone-acetaminophen (NORCO/VICODIN) 5-325 MG per tablet Take 2 tablets by mouth every 4 (four) hours as needed for moderate pain. Patient not taking: Reported on 11/02/2014 01/03/14   Gilda Crease, MD  ibuprofen (ADVIL,MOTRIN) 600 MG tablet Take 1 tablet (600 mg total) by mouth every 6 (six) hours  as needed. 12/17/14   Myra Rude, MD  nicotine (NICODERM CQ) 7 mg/24hr patch Place 7 mg onto the skin daily.    Historical Provider, MD   BP 140/81 mmHg  Pulse 63  Temp(Src) 97.6 F (36.4 C) (Oral)  Resp 19  Ht 5\' 11"  (1.803 m)  Wt 150 lb (68.04 kg)  BMI 20.93 kg/m2  SpO2 98% Physical Exam  Constitutional: He is oriented to person, place, and time. He appears well-developed and well-nourished.  HENT:  Head: Normocephalic and atraumatic.  Eyes: Conjunctivae and EOM are normal.  Neck: Normal  range of motion.  Cardiovascular: Normal rate, regular rhythm and normal heart sounds.   No murmur heard. Pulmonary/Chest: Effort normal and breath sounds normal. He has no wheezes. He has no rales.  Abdominal: Soft.  Musculoskeletal:       Back:  Normal ambuation on Tip toe walking  Normal ambulation in Heel walking  Normal gait  Back: symmetric, no erythema or ecchymosis  TTP proximal to the iliac crest in lumbar spine  No paraspinal or spinal tenderness  Pain exacerbated with flexion and extension  +2 DTR's in achilles  5/5 strength in LE b/l  Normal plantar and dorsi flexion  Neg straight leg test b/l    Neurological: He is alert and oriented to person, place, and time.  Skin: Skin is warm. No rash noted.    ED Course  Procedures (including critical care time) Labs Review Labs Reviewed - No data to display  Imaging Review No results found.   EKG Interpretation None      Medications  ibuprofen (ADVIL,MOTRIN) tablet 600 mg (600 mg Oral Given 12/17/14 0733)  cyclobenzaprine (FLEXERIL) tablet 5 mg (5 mg Oral Given 12/17/14 0734)     MDM   Final diagnoses:  Left-sided low back pain without sciatica   Mr. Galligher is p/w lumbar back pain that is most likely a muscle strain. No urinary or bowel incontinence. Reports having similar pain about 6 years ago. No weakness or tingling and normal muscle strength.  Will give ibuprofen 600 mg and flexeril and advised to f/u with primary doctor if symptoms persists.   Myra Rude, MD PGY-2, [facility] 12/17/2014, 8:27 AM      Myra Rude, MD 12/17/14 9735  Nelva Nay, MD 12/25/14 [phone]

## 2014-12-25 ENCOUNTER — Encounter (HOSPITAL_COMMUNITY): Payer: Self-pay | Admitting: Emergency Medicine

## 2014-12-25 ENCOUNTER — Emergency Department (HOSPITAL_COMMUNITY)
Admission: EM | Admit: 2014-12-25 | Discharge: 2014-12-25 | Disposition: A | Discharge status: Home / Self Care | Payer: PRIVATE HEALTH INSURANCE | Attending: Emergency Medicine | Admitting: Emergency Medicine

## 2014-12-25 DIAGNOSIS — Z7982 Long term (current) use of aspirin: Secondary | ICD-10-CM | POA: Insufficient documentation

## 2014-12-25 DIAGNOSIS — Z720 Tobacco use: Secondary | ICD-10-CM | POA: Insufficient documentation

## 2014-12-25 DIAGNOSIS — M545 Low back pain, unspecified: Secondary | ICD-10-CM

## 2014-12-25 DIAGNOSIS — Z9861 Coronary angioplasty status: Secondary | ICD-10-CM | POA: Insufficient documentation

## 2014-12-25 DIAGNOSIS — I2510 Atherosclerotic heart disease of native coronary artery without angina pectoris: Secondary | ICD-10-CM | POA: Insufficient documentation

## 2014-12-25 DIAGNOSIS — Z9889 Other specified postprocedural states: Secondary | ICD-10-CM | POA: Insufficient documentation

## 2014-12-25 DIAGNOSIS — Z79899 Other long term (current) drug therapy: Secondary | ICD-10-CM | POA: Insufficient documentation

## 2014-12-25 MED ORDER — HYDROCODONE-ACETAMINOPHEN 5-325 MG PO TABS
1.0000 | ORAL_TABLET | Freq: Once | ORAL | Status: AC
  Administered 2014-12-25: 1 via ORAL
  Filled 2014-12-25: qty 1

## 2014-12-25 MED ORDER — ONDANSETRON 4 MG PO TBDP
4.0000 mg | ORAL_TABLET | Freq: Once | ORAL | Status: AC
  Administered 2014-12-25: 4 mg via ORAL
  Filled 2014-12-25: qty 1

## 2014-12-25 MED ORDER — HYDROCODONE-ACETAMINOPHEN 5-325 MG PO TABS
2.0000 | ORAL_TABLET | ORAL | Status: AC | PRN
Start: 2014-12-25 — End: ?

## 2014-12-25 NOTE — ED Notes (Addendum)
Pt reports he was seen and treated for same c/o back pain on 6/18. Pt reports his back went out on 6/16, pain to left lower back. Pt was prescribed cyclobenzpar and ibuprofen. Pt reports no relief in back pain. At present pain 10/10. Pt ambulatory.  Denies bowel or bladder incontinence. Denies numbness or tingling in legs.

## 2014-12-25 NOTE — ED Provider Notes (Signed)
CSN: [id number]     Arrival date & time 12/25/14  4098 History   First MD Initiated Contact with Patient 12/25/14 [phone]     Chief Complaint  Patient presents with  . Back Pain     (Consider location/radiation/quality/duration/timing/severity/associated sxs/prior Treatment) HPI [name] is a 49 year old male with a history of PVD, CAD with stent placement who presents with left-sided back pain that began 9 days ago. He describes the pain as a tightness and as though his back a stop. It is worse with movement and better with standing. He rates the pain at 10 out of 10 with no recurrent radiculopathy. Patient was given Flexeril and ibuprofen after being seen on 12/17/2014 in the ED. Patient states the medications did not work and is requesting Norco. He states he has had this in the past and that's what helped. He denies any fever, chills, weakness, numbness, night sweats, abdominal pain, urinary or bowel incontinence or retention. He denies any history of IV drug use, recent steroid use, back surgeries. Past Medical History  Diagnosis Date  . Peripheral vascular disease   . Complication of anesthesia   . PONV (postoperative nausea and vomiting)   . Family history of anesthesia complication     " MOTHER HAS NAUSEA ALSO "  . Coronary artery disease   . YNWGNFAO(130.8)    Past Surgical History  Procedure Laterality Date  . Coronary angioplasty with stent placement    . Iliac artery stent Right 12/27/2013    dr Edilia Bo  . Abdominal aortagram N/A 12/27/2013    Procedure: ABDOMINAL Ronny Flurry;  Surgeon: Chuck Hint, MD;  Location: [facility] CATH LAB;  Service: Cardiovascular;  Laterality: N/A;  . Cataract extraction, bilateral     Family History  Problem Relation Age of Onset  . Deep vein thrombosis Mother   . Hyperlipidemia Mother   . Hypertension Mother   . Peripheral vascular disease Mother   . Hyperlipidemia Father   . Hypertension Father    History  Substance Use Topics   . Smoking status: Current Every Day Smoker -- 1.50 packs/day for 20 years    Last Attempt to Quit: 12/01/2013  . Smokeless tobacco: Never Used  . Alcohol Use: No    Review of Systems  Musculoskeletal: Positive for back pain. Negative for joint swelling and neck pain.  Neurological: Negative for weakness and numbness.      Allergies  Codeine and Other  Home Medications   Prior to Admission medications   Medication Sig Start Date End Date Taking? Authorizing Provider  aspirin 81 MG chewable tablet Chew 81 mg by mouth daily.     Historical Provider, MD  clopidogrel (PLAVIX) 75 MG tablet Take 1 tablet (75 mg total) by mouth daily. Patient not taking: Reported on 11/02/2014 02/09/14   Chuck Hint, MD  cyclobenzaprine (FLEXERIL) 5 MG tablet Take 1 tablet (5 mg total) by mouth 3 (three) times daily as needed for muscle spasms. 12/17/14   Myra Rude, MD  HYDROcodone-acetaminophen (NORCO/VICODIN) 5-325 MG per tablet Take 2 tablets by mouth every 4 (four) hours as needed. 12/25/14   Kasch Borquez Patel-Mills, PA-C  ibuprofen (ADVIL,MOTRIN) 600 MG tablet Take 1 tablet (600 mg total) by mouth every 6 (six) hours as needed. 12/17/14   Myra Rude, MD  nicotine (NICODERM CQ) 7 mg/24hr patch Place 7 mg onto the skin daily.    Historical Provider, MD   BP 133/74 mmHg  Pulse 62  Temp(Src) 98 F (36.7 C) (Oral)  Resp 16  SpO2 99% Physical Exam  Constitutional: He is oriented to person, place, and time. He appears well-developed and well-nourished.  HENT:  Head: Normocephalic and atraumatic.  Eyes: Conjunctivae are normal.  Neck: Normal range of motion.  Cardiovascular: Normal rate, regular rhythm and normal heart sounds.   Pulmonary/Chest: Effort normal.  Musculoskeletal: Normal range of motion. He exhibits no edema.  He has no erythema, ecchymosis, edema to the back. He is ambulatory with steady gait. He has tenderness to palpation of the left iliac crest but no midline lumbar  tenderness. Negative straight leg raise bilaterally. Normal plantar dorsiflexion. No foot drop. 5/5 strength in the lower extremities bilaterally. No saddle anesthesia.  Neurological: He is alert and oriented to person, place, and time.  Skin: Skin is warm and dry.  Psychiatric: He has a normal mood and affect. His behavior is normal.  Nursing note and vitals reviewed.   ED Course  Procedures (including critical care time) Labs Review Labs Reviewed - No data to display  Imaging Review No results found.   EKG Interpretation None      MDM   Final diagnoses:  Left-sided low back pain without sciatica   Patient's exam is not concerning for epidural abscess or cord compression. His vitals are stable. I gave the patient return precautions such as bowel or bladder incontinence or retention, numbness or tingling in the lower extremities. I also gave the patient 8 Norco. He still had Flexeril remaining from being seen on 12/17/2014. Patient verbally agrees with the plan.     Catha Gosselin, PA-C 12/25/14 1509  Gerhard Munch, MD 12/25/14 [phone]

## 2014-12-25 NOTE — ED Notes (Signed)
PA at bedside.

## 2014-12-25 NOTE — Discharge Instructions (Signed)
Back Exercises Return for bowel or bladder incontinence or retention, numbness or weakness in the lower extremities. Back exercises help treat and prevent back injuries. The goal of back exercises is to increase the strength of your abdominal and back muscles and the flexibility of your back. These exercises should be started when you no longer have back pain. Back exercises include:  Pelvic Tilt. Lie on your back with your knees bent. Tilt your pelvis until the lower part of your back is against the floor. Hold this position 5 to 10 sec and repeat 5 to 10 times.  Knee to Chest. Pull first 1 knee up against your chest and hold for 20 to 30 seconds, repeat this with the other knee, and then both knees. This may be done with the other leg straight or bent, whichever feels better.  Sit-Ups or Curl-Ups. Bend your knees 90 degrees. Start with tilting your pelvis, and do a partial, slow sit-up, lifting your trunk only 30 to 45 degrees off the floor. Take at least 2 to 3 seconds for each sit-up. Do not do sit-ups with your knees out straight. If partial sit-ups are difficult, simply do the above but with only tightening your abdominal muscles and holding it as directed.  Hip-Lift. Lie on your back with your knees flexed 90 degrees. Push down with your feet and shoulders as you raise your hips a couple inches off the floor; hold for 10 seconds, repeat 5 to 10 times.  Back arches. Lie on your stomach, propping yourself up on bent elbows. Slowly press on your hands, causing an arch in your low back. Repeat 3 to 5 times. Any initial stiffness and discomfort should lessen with repetition over time.  Shoulder-Lifts. Lie face down with arms beside your body. Keep hips and torso pressed to floor as you slowly lift your head and shoulders off the floor. Do not overdo your exercises, especially in the beginning. Exercises may cause you some  mild back discomfort which lasts for a few minutes; however, if the pain is more severe, or lasts for more than 15 minutes, do not continue exercises until you see your caregiver. Improvement with exercise therapy for back problems is slow.  See your caregivers for assistance with developing a proper back exercise program. Document Released: 07/25/2004 Document Revised: 09/09/2011 Document Reviewed: 04/18/2011 [facility] Patient Information 2015 [place], [place]. This information is not intended to replace advice given to you by your health care provider. Make sure you discuss any questions you have with your health care provider.

## 2015-05-08 ENCOUNTER — Encounter: Payer: Self-pay | Admitting: Vascular Surgery

## 2015-05-10 ENCOUNTER — Encounter (HOSPITAL_COMMUNITY): Payer: PRIVATE HEALTH INSURANCE

## 2015-05-10 ENCOUNTER — Ambulatory Visit: Payer: PRIVATE HEALTH INSURANCE | Admitting: Vascular Surgery
